# Patient Record
Sex: Female | Born: 1995 | Marital: Single
Health system: Midwestern US, Community
[De-identification: ages and names within clinical notes are randomized; demographics above are authoritative.]

## PROBLEM LIST (undated history)

## (undated) DIAGNOSIS — E039 Hypothyroidism, unspecified: Secondary | ICD-10-CM

## (undated) DIAGNOSIS — R569 Unspecified convulsions: Secondary | ICD-10-CM

## (undated) DIAGNOSIS — G43909 Migraine, unspecified, not intractable, without status migrainosus: Secondary | ICD-10-CM

## (undated) DIAGNOSIS — F445 Conversion disorder with seizures or convulsions: Secondary | ICD-10-CM

## (undated) DIAGNOSIS — N946 Dysmenorrhea, unspecified: Secondary | ICD-10-CM

## (undated) DIAGNOSIS — Z3A28 28 weeks gestation of pregnancy: Secondary | ICD-10-CM

## (undated) DIAGNOSIS — O26843 Uterine size-date discrepancy, third trimester: Secondary | ICD-10-CM

## (undated) HISTORY — PX: LYMPH NODE DISSECTION: SHX5087

---
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
## 2017-11-11 ENCOUNTER — Emergency Department (HOSPITAL_COMMUNITY)
Admission: EM | Admit: 2017-11-11 | Discharge: 2017-11-12 | Disposition: A | Discharge status: Home / Self Care | Payer: BLUE CROSS/BLUE SHIELD | Attending: Emergency Medicine | Admitting: Emergency Medicine

## 2017-11-11 ENCOUNTER — Other Ambulatory Visit: Payer: Self-pay

## 2017-11-11 ENCOUNTER — Encounter (HOSPITAL_COMMUNITY): Payer: Self-pay | Admitting: Emergency Medicine

## 2017-11-11 DIAGNOSIS — R569 Unspecified convulsions: Secondary | ICD-10-CM | POA: Diagnosis not present

## 2017-11-11 DIAGNOSIS — R4182 Altered mental status, unspecified: Secondary | ICD-10-CM | POA: Insufficient documentation

## 2017-11-11 HISTORY — DX: Unspecified convulsions: R56.9

## 2017-11-11 LAB — CBG MONITORING, ED: Glucose-Capillary: 85 mg/dL (ref 70–99)

## 2017-11-11 NOTE — ED Provider Notes (Signed)
[facility] Provider Note   CSN: [id number] Arrival date & time: 11/11/17  2249     History   Chief Complaint Chief Complaint  Patient presents with  . Seizures    HPI Audrey Arnold is a 22 y.o. female.  HPI  This is a 22 year old female with a reported history of seizure who presents after reported seizure activity.  Per EMS report, patient was noted to have tonic-clonic activity.  She was given 5 IM Versed.  No further seizure activity noted.  She reportedly does not take medications for this.  Patient is noncontributory at this time to history taking.  No active seizure activity noted.  She is arousable but does not follow commands and does not answer questions.  Appears to be moving all 4 extremities  Level 5 caveat for altered mental status  1:04 AM  Patient now awake, alert, oriented.  Reports increasing anxiety at school.  States that she last remembers stating that she needed to go for a walk.  She then woke up in the hospital.  Reports history of seizures in high school.  She states that "I had a full work-up."  Reports that she never took medications and that this was related to an implantable contraceptive which she had removed.  She reports one "mild seizure" when she came here for school and had been referred to neurology.  She has an appointment on October 2.  Denies any alcohol or drug use.  Past Medical History:  Diagnosis Date  . Seizures (HCC)     There are no active problems to display for this patient.   OB History   None      Home Medications    Prior to Admission medications   Medication Sig Start Date End Date Taking? Authorizing Provider  levETIRAcetam (KEPPRA) 500 MG tablet Take 1 tablet (500 mg total) by mouth 2 (two) times daily. 11/12/17   Kosisochukwu Burningham, Mayer Maskerourtney F, MD    Family History No family history on file.  Social History Social History   Tobacco Use  . Smoking status: Not on file  Substance Use  Topics  . Alcohol use: Not on file  . Drug use: Not on file     Allergies   Patient has no allergy information on record.   Review of Systems Review of Systems  Unable to perform ROS: Mental status change     Physical Exam Updated Vital Signs BP 132/78   Pulse 79   Temp 98 F (36.7 C) (Oral)   Resp 17   SpO2 97%   Physical Exam  Constitutional: She appears well-developed and well-nourished. No distress.  ABCs intact  HENT:  Head: Normocephalic and atraumatic.  Eyes: Pupils are equal, round, and reactive to light.  Pupils 8 mm reactive bilaterally  Neck: Neck supple.  Cardiovascular: Normal rate, regular rhythm and normal heart sounds.  Pulmonary/Chest: Effort normal and breath sounds normal. No respiratory distress. She has no wheezes.  Abdominal: Soft. There is no tenderness.  Neurological:  Somnolent but arousable to verbal stimulus, does not follow commands at this time, appears to spontaneously move all 4 extremities  Skin: Skin is warm and dry.  Psychiatric: She has a normal mood and affect.  Nursing note and vitals reviewed.    ED Treatments / Results  Labs (all labs ordered are listed, but only abnormal results are displayed) Labs Reviewed  BASIC METABOLIC PANEL - Abnormal; Notable for the following components:  Result Value   Calcium 8.7 (*)    All other components within normal limits  CBC WITH DIFFERENTIAL/PLATELET  CBG MONITORING, ED  I-STAT BETA HCG BLOOD, ED ([facility], [facility], [facility] ONLY)    EKG EKG Interpretation  Date/Time:  Monday November 11 2017 23:03:38 EDT Ventricular Rate:  86 PR Interval:    QRS Duration: 81 QT Interval:  362 QTC Calculation: 433 R Axis:   85 Text Interpretation:  Sinus rhythm Confirmed by Ross Marcus ([id number]) on 11/11/2017 11:40:22 PM   Radiology No results found.  Procedures Procedures (including critical care time)  Medications Ordered in ED Medications  levETIRAcetam (KEPPRA) tablet 500 mg (500 mg Oral  Given 11/12/17 0126)     Initial Impression / Assessment and Plan / ED Course  I have reviewed the triage vital signs and the nursing notes.  Pertinent labs & imaging results that were available during my care of the patient were reviewed by me and considered in my medical decision making (see chart for details).  Clinical Course as of Nov 12 128  Tue Nov 12, 2017  0106 Discussed patient with Dr. Wilford Corner.  Given recurrent seizure, would recommend starting Keppra 500 mg twice daily   [CH]    Clinical Course User Index [CH] Jazalyn Mondor, Mayer Masker, MD    Patient presents with seizure-like activity.  Initially somnolent.  Likely related to postictal versus sedation.  Lab work-up is reassuring.  She is not hypoglycemic.  No metabolic derangements.  On recheck, she is now back to her baseline and appears neurologically intact.  Reports history of seizures and possible recent seizure for which she was referred to neurology.  Given this, I discussed with on-call neurology who recommends starting on antiepileptics.  Will start on Keppra twice daily.  Patient is ambulatory and at her baseline otherwise.  After history, exam, and medical workup I feel the patient has been appropriately medically screened and is safe for discharge home. Pertinent diagnoses were discussed with the patient. Patient was given return precautions.   Final Clinical Impressions(s) / ED Diagnoses   Final diagnoses:  Seizure [facility])    ED Discharge Orders         Ordered    levETIRAcetam (KEPPRA) 500 MG tablet  2 times daily     11/12/17 0129           Shamonique Battiste, Mayer Masker, MD 11/12/17 [phone]

## 2017-11-11 NOTE — ED Triage Notes (Signed)
Pt to ED via EMS, started having seizure like activity x10-15 mins earlier tonight, noted by EMS to be full body, tonic-clonic seizure. S5 versed IM given PTA. Hx of seizure, but takes no meds. Bystanders st she has been anxious recently. Recently had IUD placed and seizures started after placement so she had it removed. Responsive to painful stimuli on arrival.

## 2017-11-12 ENCOUNTER — Emergency Department (HOSPITAL_COMMUNITY): Payer: BLUE CROSS/BLUE SHIELD

## 2017-11-12 ENCOUNTER — Other Ambulatory Visit: Payer: Self-pay

## 2017-11-12 ENCOUNTER — Emergency Department (HOSPITAL_COMMUNITY)
Admission: EM | Admit: 2017-11-12 | Discharge: 2017-11-12 | Disposition: A | Discharge status: Home / Self Care | Payer: BLUE CROSS/BLUE SHIELD | Source: Home / Self Care | Attending: Emergency Medicine | Admitting: Emergency Medicine

## 2017-11-12 ENCOUNTER — Encounter (HOSPITAL_COMMUNITY): Payer: Self-pay

## 2017-11-12 DIAGNOSIS — R569 Unspecified convulsions: Secondary | ICD-10-CM

## 2017-11-12 LAB — BASIC METABOLIC PANEL
ANION GAP: 5 (ref 5–15)
BUN: 16 mg/dL (ref 6–20)
CHLORIDE: 108 mmol/L (ref 98–111)
CO2: 27 mmol/L (ref 22–32)
Calcium: 8.7 mg/dL — ABNORMAL LOW (ref 8.9–10.3)
Creatinine, Ser: 0.9 mg/dL (ref 0.44–1.00)
GFR calc Af Amer: >60 mL/min (ref >60)
GLUCOSE: 86 mg/dL (ref 70–99)
POTASSIUM: 4.4 mmol/L (ref 3.5–5.1)
Sodium: 140 mmol/L (ref 135–145)

## 2017-11-12 LAB — I-STAT BETA HCG BLOOD, ED (MC, WL, AP ONLY): I-stat hCG, quantitative: <5 m[IU]/mL (ref <5)

## 2017-11-12 LAB — COMPREHENSIVE METABOLIC PANEL
ALBUMIN: 4.2 g/dL (ref 3.5–5.0)
ALT: 15 U/L (ref 0–44)
AST: 23 U/L (ref 15–41)
Alkaline Phosphatase: 70 U/L (ref 38–126)
Anion gap: 7 (ref 5–15)
BUN: 17 mg/dL (ref 6–20)
CHLORIDE: 108 mmol/L (ref 98–111)
CO2: 27 mmol/L (ref 22–32)
Calcium: 9.3 mg/dL (ref 8.9–10.3)
Creatinine, Ser: 0.83 mg/dL (ref 0.44–1.00)
GFR calc non Af Amer: >60 mL/min (ref >60)
GLUCOSE: 84 mg/dL (ref 70–99)
Potassium: 4 mmol/L (ref 3.5–5.1)
SODIUM: 142 mmol/L (ref 135–145)
Total Bilirubin: 0.7 mg/dL (ref 0.3–1.2)
Total Protein: 7.2 g/dL (ref 6.5–8.1)

## 2017-11-12 LAB — CBC WITH DIFFERENTIAL/PLATELET
ABS IMMATURE GRANULOCYTES: 0 10*3/uL (ref 0.0–0.1)
Basophils Absolute: 0 10*3/uL (ref 0.0–0.1)
Basophils Absolute: 0.1 10*3/uL (ref 0.0–0.1)
Basophils Relative: 0 %
Basophils Relative: 1 %
Eosinophils Absolute: 0 10*3/uL (ref 0.0–0.7)
Eosinophils Absolute: 0.1 10*3/uL (ref 0.0–0.7)
Eosinophils Relative: 1 %
Eosinophils Relative: 1 %
HEMATOCRIT: 38.7 % (ref 36.0–46.0)
HEMATOCRIT: 39.8 % (ref 36.0–46.0)
HEMOGLOBIN: 12.4 g/dL (ref 12.0–15.0)
HEMOGLOBIN: 13.2 g/dL (ref 12.0–15.0)
IMMATURE GRANULOCYTES: 0 %
LYMPHS ABS: 1.5 10*3/uL (ref 0.7–4.0)
LYMPHS ABS: 2.4 10*3/uL (ref 0.7–4.0)
Lymphocytes Relative: 18 %
Lymphocytes Relative: 24 %
MCH: 30.2 pg (ref 26.0–34.0)
MCH: 30.2 pg (ref 26.0–34.0)
MCHC: 32 g/dL (ref 30.0–36.0)
MCHC: 33.2 g/dL (ref 30.0–36.0)
MCV: 91.1 fL (ref 78.0–100.0)
MCV: 94.2 fL (ref 78.0–100.0)
MONO ABS: 0.9 10*3/uL (ref 0.1–1.0)
MONOS PCT: 7 %
MONOS PCT: 9 %
Monocytes Absolute: 0.6 10*3/uL (ref 0.1–1.0)
NEUTROS ABS: 6.5 10*3/uL (ref 1.7–7.7)
NEUTROS PCT: 65 %
NEUTROS PCT: 74 %
Neutro Abs: 6.2 10*3/uL (ref 1.7–7.7)
Platelets: 207 10*3/uL (ref 150–400)
Platelets: 247 10*3/uL (ref 150–400)
RBC: 4.11 MIL/uL (ref 3.87–5.11)
RBC: 4.37 MIL/uL (ref 3.87–5.11)
RDW: 13 % (ref 11.5–15.5)
RDW: 13.5 % (ref 11.5–15.5)
WBC: 10 10*3/uL (ref 4.0–10.5)
WBC: 8.4 10*3/uL (ref 4.0–10.5)

## 2017-11-12 LAB — CBG MONITORING, ED: GLUCOSE-CAPILLARY: 97 mg/dL (ref 70–99)

## 2017-11-12 MED ORDER — LEVETIRACETAM 500 MG PO TABS
500.0000 mg | ORAL_TABLET | Freq: Once | ORAL | Status: AC
  Administered 2017-11-12: 500 mg via ORAL
  Filled 2017-11-12: qty 1

## 2017-11-12 MED ORDER — LEVETIRACETAM IN NACL 1000 MG/100ML IV SOLN
1000.0000 mg | Freq: Once | INTRAVENOUS | Status: AC
  Administered 2017-11-12: 1000 mg via INTRAVENOUS
  Filled 2017-11-12: qty 100

## 2017-11-12 MED ORDER — SODIUM CHLORIDE 0.9 % IV BOLUS
1000.0000 mL | Freq: Once | INTRAVENOUS | Status: AC
  Administered 2017-11-12: 1000 mL via INTRAVENOUS

## 2017-11-12 MED ORDER — LEVETIRACETAM 500 MG PO TABS: 500.0000 mg | ORAL_TABLET | Freq: Two times a day (BID) | ORAL | 0 refills | Status: DC

## 2017-11-12 NOTE — ED Provider Notes (Signed)
Care assumed from PA list summary at discharge, please see her note for full details, but in brief Audrey Arnold is a 22 y.o. female who was diagnosed with seizures in teens, put on keppra but not currently taking, was told seizures were due to nexplanon, after removal of the Nexplanon patient had no further seizures for 9 months until yesterday.  [facility]ra and discharged home with neuro follow up, when going to pick up her Keppra prescription today she had additional seizures. Seized multiple times today, questionable if true seizure activity.  Received Versed with EMS, but seizure is here in the ED have resolved spontaneously patient loaded with 1000 mg IV Keppra but has not required any additional benzos.  MRI ordered and neurology consulted prior to signout.  MRI pending, no further seizure activity since Keppra.  Plan: Follow-up MRI, obs for 4-6 hours with no further seizure activity (8PM) can be D/C with 500 keppra prior to discharge  If anymore seizures [facility] for [facility] and [facility]  Mr Brain Wo Contrast  Result Date: 11/12/2017 CLINICAL DATA:  22 year old female status post 2nd witnessed seizure in 2 days. Prior to that seizure free for 2 years. EXAM: MRI HEAD WITHOUT CONTRAST TECHNIQUE: Multiplanar, multiecho pulse sequences of the brain and surrounding structures were obtained without intravenous contrast. COMPARISON:  None. FINDINGS: Brain: Normal cerebral volume. No restricted diffusion to suggest acute infarction. No midline shift, mass effect, evidence of mass lesion, ventriculomegaly, extra-axial collection or acute intracranial hemorrhage. Cervicomedullary junction and pituitary are within normal limits. Thin slice T2 and FLAIR imaging of the temporal lobes demonstrates a symmetric and normal appearance of the hippocampal formations. Elsewhere gray and white matter signal is normal throughout the brain. No encephalomalacia or chronic cerebral blood  products identified. Vascular: Major intracranial vascular flow voids are preserved. Skull and upper cervical spine: Normal visible cervical spine. Normal bone marrow signal. Sinuses/Orbits: Normal orbits soft tissues. Paranasal sinuses are clear. Other: Mastoid air cells are clear. Visible internal auditory structures appear normal. Scalp and face soft tissues appear negative. IMPRESSION: Normal noncontrast MRI appearance of the brain. Electronically Signed   By: Odessa FlemingH  Hall M.D.   On: 11/12/2017 17:24   Labs Reviewed  COMPREHENSIVE METABOLIC PANEL  CBC WITH DIFFERENTIAL/PLATELET  CBG MONITORING, ED  I-STAT BETA HCG BLOOD, ED ([facility], [facility], [facility] ONLY)    Has been observed in the emergency department has had no further seizure activity over the past 5 hours.  MRI of the brain is unremarkable.  P.o. dose of Keppra given, patient is back to her baseline with no further post ictal symptoms.  Normal neurologic exam.  Family at the bedside, at this time feel patient is stable for discharge home with close follow-up in the next 2 weeks with neurology.  Patient's family has picked up prescription for Keppra which she will begin taking twice daily as directed.  Driving precautions in place.   At this time there does not appear to be any evidence of an acute emergency medical condition and the patient appears stable for discharge with appropriate outpatient follow up.Diagnosis was discussed with patient who verbalizes understanding and is agreeable to discharge.   Dartha LodgeFord, Kelbi Renstrom N, PA-C 11/12/17 2059    Little, Ambrose Finlandachel Morgan, MD 11/13/17 [phone]

## 2017-11-12 NOTE — ED Notes (Signed)
Bed: [id number] Expected date:  Expected time:  Means of arrival:  Comments: EMS- 22yo F, seizure

## 2017-11-12 NOTE — Discharge Instructions (Addendum)
You were seen today for seizures.  I placed a referral to neurology to see if we can move up your appointment.  Please resume your Keppra tomorrow, 500 mg twice daily.  If you are on oral contraceptive pills, please be aware that they may not be as effective while taking Keppra.  Please return the emergency department if you have any further episodes of seizure-like activity where you are seizing for more than 5 minutes, or you have multiple seizures back to back without return to your baseline.  Please return for any weakness or numbness, new or worsening headaches, or any new or worsening symptoms.  Thank you for allowing Audrey Arnold to participate in your care today.

## 2017-11-12 NOTE — Discharge Instructions (Addendum)
You were seen today for seizure-like activity.  He will be started on seizure medications.  It is very important that you follow-up with neurology as scheduled.  Take seizure medications as directed.  Do not drive or operate heavy machinery until cleared by neurology.

## 2017-11-12 NOTE — ED Provider Notes (Signed)
[facility]-EMERGENCY DEPT Provider Note   CSN: [id number] Arrival date & time: 11/12/17  1419     History   Chief Complaint Chief Complaint  Patient presents with  . Seizures    HPI Audrey Arnold is a 22 y.o. female.  HPI  Patient is a 22 year old female with a history of seizures, not on antiepileptics at present, presenting for repetitive seizures without return to neurologic baseline today.  History obtained with the assistance of patient's friend who witnessed the seizures.  Patient was driving to the pharmacy with her friend today, when in the seat of the car she began having what her friend described as tonic-clonic motions.  He describes them as 7-8 discrete episodes without return to neurologic baseline, lasting approximately 7 to 10 minutes.  Per EMS report, patient was responsive, alert, and oriented when they arrived, but began having a repeat seizure and was given 2.5 mg of Versed.  Patient sleepy but alert voice and to provide history.  Patient reports that she was diagnosed with seizures in high school, approximately 3 years ago, and she was previously on Keppra, but had a period of several months without antiepileptics.  Patient reports she was told that her seizures were due to her Nexplanon, and once it was removed, she had improvement in her seizures until yesterday.  Patient denies any fevers, chills, neck stiffness, chest pain, shortness breath, cough, abdominal pain, nausea, vomiting, diarrhea, or dysuria.  Patient reports a frontal headache that is consistent with all prior headaches at this time, but without visual disturbance, weakness, or numbness over the past 12 hours.  Past Medical History:  Diagnosis Date  . Seizures (HCC)     There are no active problems to display for this patient.   History reviewed. No pertinent surgical history.   OB History   None      Home Medications    Prior to Admission medications   Medication Sig  Start Date End Date Taking? Authorizing Provider  levETIRAcetam (KEPPRA) 500 MG tablet Take 1 tablet (500 mg total) by mouth 2 (two) times daily. 11/12/17   Horton, Mayer Masker, MD    Family History History reviewed. No pertinent family history.  Social History Social History   Tobacco Use  . Smoking status: Not on file  Substance Use Topics  . Alcohol use: Not on file  . Drug use: Not on file     Allergies   Patient has no allergy information on record.   Review of Systems Review of Systems  Constitutional: Negative for chills and fever.  HENT: Negative for congestion and rhinorrhea.   Eyes: Negative for visual disturbance.  Respiratory: Negative for cough and shortness of breath.   Cardiovascular: Negative for chest pain.  Gastrointestinal: Negative for abdominal pain, nausea and vomiting.  Genitourinary: Negative for dysuria.  Musculoskeletal: Negative for myalgias.  Skin: Negative for rash.  Allergic/Immunologic: Negative for immunocompromised state.  Neurological: Positive for seizures and headaches. Negative for weakness and numbness.  Psychiatric/Behavioral: Negative for confusion.     Physical Exam Updated Vital Signs BP 119/69   Pulse 97   Temp 98.1 F (36.7 C) (Oral)   Resp 16   SpO2 96%   Physical Exam  Constitutional: She appears well-developed and well-nourished. No distress.  HENT:  Head: Normocephalic and atraumatic.  Mouth/Throat: Oropharynx is clear and moist.  Eyes: Pupils are equal, round, and reactive to light. Conjunctivae and EOM are normal.  Neck: Normal range of motion. Neck supple.  Cardiovascular: Normal rate, regular rhythm, S1 normal and S2 normal.  No murmur heard. Pulmonary/Chest: Effort normal and breath sounds normal. She has no wheezes. She has no rales.  Nasal cannula in place.  Abdominal: Soft. She exhibits no distension. There is no tenderness. There is no guarding.  Musculoskeletal: Normal range of motion. She exhibits no  edema or deformity.  Neurological: She is alert.  Mental Status:  Patient sleepy on initial examination, recovering from postictal state.  Alert, oriented, thought content appropriate, able to give a coherent history. Speech fluent without evidence of aphasia. Able to follow 2 step commands without difficulty.  Cranial Nerves:  II:  Peripheral visual fields grossly normal, pupils equal, round, reactive to light III,IV, VI: ptosis not present, extra-ocular motions intact bilaterally  V,VII: smile symmetric, facial light touch sensation equal VIII: hearing grossly normal to voice  X: uvula elevates symmetrically  XI: bilateral shoulder shrug symmetric and strong XII: midline tongue extension without fassiculations Motor:  Normal tone. 5/5 in upper and lower extremities bilaterally including strong and equal grip strength and dorsiflexion/plantar flexion Sensory: Pinprick and light touch normal in all extremities.  Deep Tendon Reflexes: 2+ and symmetric in the biceps and patella.  Cerebellar: normal finger-to-nose with bilateral upper extremities Gait: Deferred  Stance: No pronator drift and good coordination, strength, and position sense with tapping of bilateral arms (performed in sitting position). CV: distal pulses palpable throughout    Skin: Skin is warm and dry. No rash noted. No erythema.  Psychiatric: She has a normal mood and affect. Her behavior is normal. Judgment and thought content normal.  Nursing note and vitals reviewed.    ED Treatments / Results  Labs (all labs ordered are listed, but only abnormal results are displayed) Labs Reviewed  COMPREHENSIVE METABOLIC PANEL  CBC WITH DIFFERENTIAL/PLATELET  CBG MONITORING, ED  I-STAT BETA HCG BLOOD, ED ([facility], [facility], [facility] ONLY)    EKG EKG Interpretation  Date/Time:  Tuesday November 12 2017 14:40:49 EDT Ventricular Rate:  88 PR Interval:    QRS Duration: 79 QT Interval:  359 QTC Calculation: 435 R Axis:   86 Text  Interpretation:  Sinus rhythm Confirmed by Loren RacerYelverton, David ([id number]) on 11/12/2017 3:20:58 PM   Radiology No results found.  Procedures Procedures (including critical care time)  Medications Ordered in ED Medications  levETIRAcetam (KEPPRA) IVPB 1000 mg/100 mL premix (1,000 mg Intravenous New Bag/Given 11/12/17 1532)  sodium chloride 0.9 % bolus 1,000 mL (has no administration in time range)     Initial Impression / Assessment and Plan / ED Course  I have reviewed the triage vital signs and the nursing notes.  Pertinent labs & imaging results that were available during my care of the patient were reviewed by me and considered in my medical decision making (see chart for details).  Clinical Course as of Nov 12 1541  Tue Nov 12, 2017  1541 Reassessed at bedside, as patient had repetitive seizure-like activity.  Patient had full body clonic motions, as well as apparent posturing of back.  Patient had decreased muscle tone on examination during seizure like activity.  Heart rate 143 during seizure-like activity.   [AM]    Clinical Course User Index [AM] Elisha PonderMurray, Barbaraann Avans B, PA-C   3:53 PM Spoke with Dr. Laurence SlateAroor of neurology, who recommends MRI, 1000 mg Keppra loading dose, and if patient has any further seizure-like activity, [facility] further work-up.  Also recommends that patient received p.o. dose  of Keppra prior to leaving.  Recommends expedited follow-up with neurology, which I will arrange. Recommends 4-6 hour observation w/o seizure-like activity.  I appreciate Dr. Delaney Meigs involvement in the care of this patient.  Patient updated on plan of care.  If patient is able to go home, patient to resume normal Keppra regimen tomorrow.  Care signed out to Jodi Geralds, PA-C at 4:47 PM.   This is a shared visit with Dr. Loren Racer. Patient was independently evaluated by this attending physician. Attending physician consulted in evaluation and  management.  Final Clinical Impressions(s) / ED Diagnoses   Final diagnoses:  Seizure [facility])    ED Discharge Orders    None       Delia Chimes 11/12/17 1648    Loren Racer, MD 11/13/17 1327

## 2017-11-12 NOTE — ED Triage Notes (Signed)
EMS reports witnesses seizure, grand maul. Hx of seizures non compliant with meds, [facility] has not [facility] RX.  BP 120/58 HR 110 RR 28 CBG 90  20 L forearm 2.5 Versed IV enroute.

## 2017-11-13 ENCOUNTER — Encounter (HOSPITAL_COMMUNITY): Payer: Self-pay

## 2017-11-13 ENCOUNTER — Emergency Department (HOSPITAL_COMMUNITY)
Admission: EM | Admit: 2017-11-13 | Discharge: 2017-11-14 | Disposition: A | Discharge status: Home / Self Care | Payer: BLUE CROSS/BLUE SHIELD | Attending: Emergency Medicine | Admitting: Emergency Medicine

## 2017-11-13 ENCOUNTER — Other Ambulatory Visit: Payer: Self-pay

## 2017-11-13 DIAGNOSIS — R569 Unspecified convulsions: Secondary | ICD-10-CM | POA: Diagnosis not present

## 2017-11-13 DIAGNOSIS — Z79899 Other long term (current) drug therapy: Secondary | ICD-10-CM | POA: Diagnosis not present

## 2017-11-13 NOTE — ED Notes (Signed)
Seizure pads applied to stretcher, suction available at bedside. Pt protecting airway at this time, no seizure activity noted, VSS

## 2017-11-13 NOTE — ED Triage Notes (Signed)
Pt BIB [facility] for eval of seizure. Pt has been seen twice in past two days for seizure activity. This is third seizure episode in 3 days. Ems reports friends witnessed one seizure, called 911, as EMS was loading pt into ambulance, noted a second seizure lasting approx 45 seconds. Pt received 5mg  versed by EMS, pt arrives somnolent, but able to answer some questions. No fall w/ seizure or injury noted.  Satting 99% on RA

## 2017-11-14 NOTE — ED Provider Notes (Signed)
[facility] EMERGENCY DEPARTMENT Provider Note  CSN: [id number] Arrival date & time: 11/13/17 2256  Chief Complaint(s) Seizures  HPI Audrey Arnold is a 22 y.o. female with a history of seizures presents to the emergency department after seizure episode that lasted approximately 45 seconds.  EMS was called who gave the patient 5 mg of Versed in route as she had another episode that they witnessed. This is a third episode in 3 days.  Patient was started on Keppra on 26 August.  She was unable to get the medication until yesterday.  She was actually seen yesterday and loaded with Keppra in the ED.  Patient had a negative MRI.   Patient is sedated but is able to answer questions.  She denies any recent fevers or infections.  States that she is been under a lot of stress due to the seizures coming back in school.  Denies any drug use.  HPI  Past Medical History Past Medical History:  Diagnosis Date  . Seizures (HCC)    There are no active problems to display for this patient.  Home Medication(s) Prior to Admission medications   Medication Sig Start Date End Date Taking? Authorizing Provider  levETIRAcetam (KEPPRA) 500 MG tablet Take 1 tablet (500 mg total) by mouth 2 (two) times daily. 11/12/17   Horton, Mayer Masker, MD  levothyroxine (SYNTHROID, LEVOTHROID) 50 MCG tablet Take 50 mcg by mouth daily. 08/30/17   [provider]                                                                                                                                    Past Surgical History History reviewed. No pertinent surgical history. Family History History reviewed. No pertinent family history.  Social History Social History   Tobacco Use  . Smoking status: Not on file  Substance Use Topics  . Alcohol use: Not on file  . Drug use: Not on file   Allergies Patient has no known allergies.  Review of Systems Review of Systems All other systems are reviewed and are  negative for acute change except as noted in the HPI  Physical Exam Vital Signs  I have reviewed the triage vital signs BP 104/90   Pulse 83   Temp 98.2 F (36.8 C)   Resp 17   Wt 59 kg   SpO2 100%   Physical Exam  Constitutional: She is oriented to person, place, and time. She appears well-developed and well-nourished. No distress.  HENT:  Head: Normocephalic and atraumatic.  Right Ear: External ear normal.  Left Ear: External ear normal.  Nose: Nose normal.  Eyes: Conjunctivae and EOM are normal. No scleral icterus.  Neck: Normal range of motion and phonation normal.  Cardiovascular: Normal rate and regular rhythm.  Pulmonary/Chest: Effort normal. No stridor. No respiratory distress.  Abdominal: She exhibits no distension.  Musculoskeletal: Normal range of motion. She exhibits no edema.  Neurological: She is alert and oriented to person, place, and time.  Moves all extremities with equal strength.  Skin: She is not diaphoretic.  Psychiatric: She has a normal mood and affect. Her behavior is normal.  Vitals reviewed.   ED Results and Treatments Labs (all labs ordered are listed, but only abnormal results are displayed) Labs Reviewed - No data to display                                                                                                                       EKG  EKG Interpretation  Date/Time:    Ventricular Rate:    PR Interval:    QRS Duration:   QT Interval:    QTC Calculation:   R Axis:     Text Interpretation:        Radiology No results found. Pertinent labs & imaging results that were available during my care of the patient were reviewed by me and considered in my medical decision making (see chart for details).  Medications Ordered in ED Medications - No data to display                                                                                                                                  Procedures Procedures  (including  critical care time)  Medical Decision Making / ED Course I have reviewed the nursing notes for this encounter and the patient's prior records (if available in EHR or on provided paperwork).    Seizure status post 5 mg of Versed.  Patient is currently chemically sedated.  We will continue to monitor.   Patient has had extensive work-up recently and does not require any additional work-up at this time.  After several hours, patient returned to baseline mental status.  The patient appears reasonably screened and/or stabilized for discharge and I doubt any other medical condition or other [facility] requiring further screening, evaluation, or treatment in the ED at this time prior to discharge.  The patient is safe for discharge with strict return precautions.   Final Clinical Impression(s) / ED Diagnoses Final diagnoses:  Seizure (HCC)    Disposition: Discharge  Condition: Good  I have discussed the results, Dx and Tx plan with the patient who expressed understanding and agree(s) with the plan. Discharge instructions discussed at great length. The patient was given strict return precautions who verbalized understanding of the instructions. No further questions  at time of discharge.    ED Discharge Orders    None       Follow Up: Neurology   As scheduled     This chart was dictated using voice recognition software.  Despite best efforts to proofread,  errors can occur which can change the documentation meaning.   Nira Connardama, Pedro Eduardo, MD 11/14/17 [phone]

## 2017-11-15 ENCOUNTER — Emergency Department (HOSPITAL_COMMUNITY): Payer: BLUE CROSS/BLUE SHIELD

## 2017-11-15 ENCOUNTER — Encounter: Payer: Self-pay | Admitting: *Deleted

## 2017-11-15 ENCOUNTER — Observation Stay (HOSPITAL_COMMUNITY)
Admission: EM | Admit: 2017-11-15 | Discharge: 2017-11-17 | Disposition: A | Discharge status: Home / Self Care | Payer: BLUE CROSS/BLUE SHIELD | Attending: Internal Medicine | Admitting: Internal Medicine

## 2017-11-15 ENCOUNTER — Other Ambulatory Visit: Payer: Self-pay

## 2017-11-15 DIAGNOSIS — R569 Unspecified convulsions: Principal | ICD-10-CM

## 2017-11-15 DIAGNOSIS — Z79899 Other long term (current) drug therapy: Secondary | ICD-10-CM | POA: Diagnosis not present

## 2017-11-15 HISTORY — DX: Migraine, unspecified, not intractable, without status migrainosus: G43.909

## 2017-11-15 LAB — BASIC METABOLIC PANEL
Anion gap: 8 (ref 5–15)
BUN: 15 mg/dL (ref 6–20)
CO2: 27 mmol/L (ref 22–32)
CREATININE: 0.89 mg/dL (ref 0.44–1.00)
Calcium: 9.3 mg/dL (ref 8.9–10.3)
Chloride: 107 mmol/L (ref 98–111)
GFR calc Af Amer: >60 mL/min (ref >60)
Glucose, Bld: 75 mg/dL (ref 70–99)
Potassium: 4.1 mmol/L (ref 3.5–5.1)
SODIUM: 142 mmol/L (ref 135–145)

## 2017-11-15 LAB — CBC WITH DIFFERENTIAL/PLATELET
Basophils Absolute: 0 10*3/uL (ref 0.0–0.1)
Basophils Relative: 0 %
EOS ABS: 0.1 10*3/uL (ref 0.0–0.7)
EOS PCT: 1 %
HCT: 39.6 % (ref 36.0–46.0)
Hemoglobin: 13.2 g/dL (ref 12.0–15.0)
LYMPHS ABS: 2.1 10*3/uL (ref 0.7–4.0)
Lymphocytes Relative: 25 %
MCH: 30.3 pg (ref 26.0–34.0)
MCHC: 33.3 g/dL (ref 30.0–36.0)
MCV: 90.8 fL (ref 78.0–100.0)
MONO ABS: 0.6 10*3/uL (ref 0.1–1.0)
MONOS PCT: 7 %
Neutro Abs: 5.5 10*3/uL (ref 1.7–7.7)
Neutrophils Relative %: 67 %
PLATELETS: 248 10*3/uL (ref 150–400)
RBC: 4.36 MIL/uL (ref 3.87–5.11)
RDW: 13.1 % (ref 11.5–15.5)
WBC: 8.2 10*3/uL (ref 4.0–10.5)

## 2017-11-15 LAB — GLUCOSE, CAPILLARY: GLUCOSE-CAPILLARY: 83 mg/dL (ref 70–99)

## 2017-11-15 LAB — I-STAT BETA HCG BLOOD, ED (MC, WL, AP ONLY)

## 2017-11-15 LAB — CBG MONITORING, ED: Glucose-Capillary: 83 mg/dL (ref 70–99)

## 2017-11-15 MED ORDER — ACETAMINOPHEN 325 MG PO TABS
650.0000 mg | ORAL_TABLET | Freq: Four times a day (QID) | ORAL | Status: DC | PRN
  Administered 2017-11-16 – 2017-11-17 (×2): 650 mg via ORAL
  Filled 2017-11-15 (×2): qty 2

## 2017-11-15 MED ORDER — AMMONIA AROMATIC IN INHA
RESPIRATORY_TRACT | Status: AC
  Administered 2017-11-15: 0.3 mL via RESPIRATORY_TRACT
  Filled 2017-11-15: qty 10

## 2017-11-15 MED ORDER — LORAZEPAM 2 MG/ML IJ SOLN
INTRAMUSCULAR | Status: AC
  Administered 2017-11-15: 1 mg via INTRAVENOUS
  Filled 2017-11-15: qty 1

## 2017-11-15 MED ORDER — SODIUM CHLORIDE 0.9 % IV SOLN
INTRAVENOUS | Status: AC
  Administered 2017-11-15: via INTRAVENOUS

## 2017-11-15 MED ORDER — ENOXAPARIN SODIUM 40 MG/0.4ML ~~LOC~~ SOLN
40.0000 mg | SUBCUTANEOUS | Status: DC
  Administered 2017-11-15 – 2017-11-16 (×2): 40 mg via SUBCUTANEOUS
  Filled 2017-11-15 (×2): qty 0.4

## 2017-11-15 MED ORDER — LEVOTHYROXINE SODIUM 50 MCG PO TABS
50.0000 ug | ORAL_TABLET | Freq: Every day | ORAL | Status: DC
  Administered 2017-11-16 – 2017-11-17 (×2): 50 ug via ORAL
  Filled 2017-11-15 (×2): qty 1

## 2017-11-15 MED ORDER — LEVETIRACETAM 500 MG PO TABS
500.0000 mg | ORAL_TABLET | Freq: Two times a day (BID) | ORAL | Status: DC
  Administered 2017-11-16 – 2017-11-17 (×3): 500 mg via ORAL
  Filled 2017-11-15 (×3): qty 1

## 2017-11-15 MED ORDER — LEVETIRACETAM IN NACL 1000 MG/100ML IV SOLN
1000.0000 mg | Freq: Once | INTRAVENOUS | Status: AC
  Administered 2017-11-15: 1000 mg via INTRAVENOUS
  Filled 2017-11-15: qty 100

## 2017-11-15 MED ORDER — LORAZEPAM 2 MG/ML IJ SOLN
1.0000 mg | Freq: Once | INTRAMUSCULAR | Status: AC
  Administered 2017-11-15: 1 mg via INTRAVENOUS

## 2017-11-15 MED ORDER — ACETAMINOPHEN 650 MG RE SUPP: 650.0000 mg | Freq: Four times a day (QID) | RECTAL | Status: DC | PRN

## 2017-11-15 MED ORDER — AMMONIA AROMATIC IN INHA
0.3000 mL | Freq: Once | RESPIRATORY_TRACT | Status: AC
  Administered 2017-11-15: 0.3 mL via RESPIRATORY_TRACT

## 2017-11-15 NOTE — ED Notes (Signed)
Pt Is being transported by [facility] at this time

## 2017-11-15 NOTE — H&P (Signed)
[facility] H&P   Patient Demographics:    Audrey Arnold, is a 22 y.o. female  MRN: [id number]   DOB - Dec 11, 1995  Admit Date - 11/15/2017  Outpatient Primary MD for the patient is Patient, No Pcp Per  Referring MD/NP/PA:   [place]  Outpatient Specialists:   Patient coming from: home  Chief Complaint  Patient presents with  . Seizures      HPI:    Audrey Arnold  is a 22 y.o. female, w hypothyroidism, seizure do apparently c/o seizure x 45 sec. 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 Doesn't recall if bit tongue , or incontinence.   EMS gave her versed.  4th episode in 3 days.  Pt states that she has been taking her keppra.  No new medication. Pt notes initially started to have seizure about 2.5 years ago.  None until the past few day.    In ED,  CT brain IMPRESSION: Normal CT appearance of the brain.  No acute cervical spine fracture or listhesis.  Wbc 8.2, hgb 13.2, Plt 248  Na 142, K 4.1, Bun 15, Creatinine 0.89  Pt will be admitted for w/up of seizure.      Review of systems:    In addition to the HPI above,     No Fever-chills, No Headache, No changes with Vision or hearing, No problems swallowing food or Liquids, No Chest pain, Cough or Shortness of Breath, No Abdominal pain, No Nausea or Vommitting, Bowel movements are regular, No Blood in stool or Urine, No dysuria, No new skin rashes or bruises, No new joints pains-aches,  No new weakness, tingling, numbness in any extremity, No recent weight gain or loss, No polyuria, polydypsia or polyphagia, No significant Mental Stressors.  A full 10 point Review of Systems was done, except as stated above, all other Review of Systems were negative.   With Past History of the following :    Past Medical History:  Diagnosis Date  . Migraine   . Seizures (HCC)       Past Surgical History:    Procedure Laterality Date  . LYMPH NODE DISSECTION        Social History:     Social History   Tobacco Use  . Smoking status: Never Smoker  . Smokeless tobacco: Never Used  Substance Use Topics  . Alcohol use: Never    Frequency: Never     Lives - at home  Mobility - walks by self   Family History :     Family History  Problem Relation Age of Onset  . Lupus Mother   . Diverticulitis Mother   . Hypertension Father        Home Medications:   Prior to Admission medications   Medication Sig Start Date End Date Taking? Authorizing Provider  levETIRAcetam (KEPPRA) 500 MG tablet Take 1 tablet (500 mg  total) by mouth 2 (two) times daily. 11/12/17  Yes Horton, Mayer Masker, MD  levothyroxine (SYNTHROID, LEVOTHROID) 50 MCG tablet Take 50 mcg by mouth daily. 08/30/17  Yes [provider]     Allergies:    No Known Allergies   Physical Exam:   Vitals  Blood pressure 114/77, pulse 89, temperature 98.8 F (37.1 C), temperature source Oral, resp. rate 17, SpO2 100 %.   1. General  lying in bed in NAD,    2. Normal affect and insight, Not Suicidal or Homicidal, Awake Alert, Oriented X 3.  3. No F.N deficits, ALL C.Nerves Intact, Strength 5/5 all 4 extremities, Sensation intact all 4 extremities, Plantars down going.  4. Ears and Eyes appear Normal, Conjunctivae clear, PERRLA. Moist Oral Mucosa.  5. Supple Neck, No JVD, No cervical lymphadenopathy appriciated, No Carotid Bruits.  6. Symmetrical Chest wall movement, Good air movement bilaterally, CTAB.  7. RRR, No Gallops, Rubs or Murmurs, No Parasternal Heave.  8. Positive Bowel Sounds, Abdomen Soft, No tenderness, No organomegaly appriciated,No rebound -guarding or rigidity.  9.  No Cyanosis, Normal Skin Turgor, No Skin Rash or Bruise.  10. Good muscle tone,  joints appear normal , no effusions, Normal ROM.  11. No Palpable Lymph Nodes in Neck or Axillae  No pronator drift   Data Review:     CBC Recent Labs  Lab 11/11/17 2348 11/12/17 1512 11/15/17 1646  WBC 10.0 8.4 8.2  HGB 12.4 13.2 13.2  HCT 38.7 39.8 39.6  PLT 207 247 248  MCV 94.2 91.1 90.8  MCH 30.2 30.2 30.3  MCHC 32.0 33.2 33.3  RDW 13.0 13.5 13.1  LYMPHSABS 2.4 1.5 2.1  MONOABS 0.9 0.6 0.6  EOSABS 0.1 0.0 0.1  BASOSABS 0.1 0.0 0.0   ------------------------------------------------------------------------------------------------------------------  Chemistries  Recent Labs  Lab 11/11/17 2348 11/12/17 1512 11/15/17 1646  NA 140 142 142  K 4.4 4.0 4.1  CL 108 108 107  CO2 27 27 27   GLUCOSE 86 84 75  BUN 16 17 15   CREATININE 0.90 0.83 0.89  CALCIUM 8.7* 9.3 9.3  AST  --  23  --   ALT  --  15  --   ALKPHOS  --  70  --   BILITOT  --  0.7  --    ------------------------------------------------------------------------------------------------------------------ CrCl cannot be calculated (Unknown ideal weight.). ------------------------------------------------------------------------------------------------------------------ No results for input(s): TSH, T4TOTAL, T3FREE, THYROIDAB in the last 72 hours.  Invalid input(s): FREET3  Coagulation profile No results for input(s): INR, PROTIME in the last 168 hours. ------------------------------------------------------------------------------------------------------------------- No results for input(s): DDIMER in the last 72 hours. -------------------------------------------------------------------------------------------------------------------  Cardiac Enzymes No results for input(s): CKMB, TROPONINI, MYOGLOBIN in the last 168 hours.  Invalid input(s): CK ------------------------------------------------------------------------------------------------------------------ No results found for: BNP   ---------------------------------------------------------------------------------------------------------------  Urinalysis No results found for:  COLORURINE, APPEARANCEUR, LABSPEC, PHURINE, GLUCOSEU, HGBUR, BILIRUBINUR, KETONESUR, PROTEINUR, UROBILINOGEN, NITRITE, LEUKOCYTESUR  ----------------------------------------------------------------------------------------------------------------   Imaging Results:    Ct Head Wo Contrast  Result Date: 11/15/2017 CLINICAL DATA:  Multiple seizures over the past week with 3 witnessed seizures in a 5 minutes. Today. EXAM: CT HEAD WITHOUT CONTRAST CT CERVICAL SPINE WITHOUT CONTRAST TECHNIQUE: Multidetector CT imaging of the head and cervical spine was performed following the standard protocol without intravenous contrast. Multiplanar CT image reconstructions of the cervical spine were also generated. COMPARISON:  MRI head 11/12/2017 FINDINGS: CT HEAD FINDINGS BRAIN: The ventricles and sulci are normal. No intraparenchymal hemorrhage, mass effect nor midline shift. No acute large vascular territory  infarcts. No abnormal extra-axial fluid collections. Basal cisterns are midline and not effaced. No acute brainstem nor cerebellar abnormality. VASCULAR: Unremarkable. SKULL/SOFT TISSUES: No skull fracture. No significant soft tissue swelling. ORBITS/SINUSES: The included ocular globes and orbital contents are normal.The mastoid air-cells and included paranasal sinuses are well-aerated. OTHER: None. CT CERVICAL SPINE FINDINGS ALIGNMENT: Vertebral bodies in alignment. Maintained lordosis. SKULL BASE AND VERTEBRAE: Cervical vertebral bodies and posterior elements are intact. Intervertebral disc heights preserved. No destructive bony lesions. C1-2 articulation maintained. SOFT TISSUES AND SPINAL CANAL: Normal. DISC LEVELS: No significant osseous canal stenosis or neural foraminal narrowing. UPPER CHEST: Lung apices are clear. OTHER: None. IMPRESSION: Normal CT appearance of the brain. No acute cervical spine fracture or listhesis. Electronically Signed   By: Tollie Ethavid  Kwon M.D.   On: 11/15/2017 18:26   Ct Cervical Spine Wo  Contrast  Result Date: 11/15/2017 CLINICAL DATA:  Multiple seizures over the past week with 3 witnessed seizures in a 5 minutes. Today. EXAM: CT HEAD WITHOUT CONTRAST CT CERVICAL SPINE WITHOUT CONTRAST TECHNIQUE: Multidetector CT imaging of the head and cervical spine was performed following the standard protocol without intravenous contrast. Multiplanar CT image reconstructions of the cervical spine were also generated. COMPARISON:  MRI head 11/12/2017 FINDINGS: CT HEAD FINDINGS BRAIN: The ventricles and sulci are normal. No intraparenchymal hemorrhage, mass effect nor midline shift. No acute large vascular territory infarcts. No abnormal extra-axial fluid collections. Basal cisterns are midline and not effaced. No acute brainstem nor cerebellar abnormality. VASCULAR: Unremarkable. SKULL/SOFT TISSUES: No skull fracture. No significant soft tissue swelling. ORBITS/SINUSES: The included ocular globes and orbital contents are normal.The mastoid air-cells and included paranasal sinuses are well-aerated. OTHER: None. CT CERVICAL SPINE FINDINGS ALIGNMENT: Vertebral bodies in alignment. Maintained lordosis. SKULL BASE AND VERTEBRAE: Cervical vertebral bodies and posterior elements are intact. Intervertebral disc heights preserved. No destructive bony lesions. C1-2 articulation maintained. SOFT TISSUES AND SPINAL CANAL: Normal. DISC LEVELS: No significant osseous canal stenosis or neural foraminal narrowing. UPPER CHEST: Lung apices are clear. OTHER: None. IMPRESSION: Normal CT appearance of the brain. No acute cervical spine fracture or listhesis. Electronically Signed   By: Tollie Ethavid  Kwon M.D.   On: 11/15/2017 18:26       Assessment & Plan:    Active Problems:   Seizure (HCC)    Seizure do Seizure precautions MRI brain 11/12/2017=> no acute process EEG ordered keppra 1000mg  iv x1 in ED Cont keppra 500mg  po bid Neurology consulted by ED, defer to neurolgy as to adjustment of keppra or initiation of 2nd  antiepileptic  DVT Prophylaxis  Lovenox - SCDs   AM Labs Ordered, also please review Full Orders  Family Communication: Admission, patients condition and plan of care including tests being ordered have been discussed with the patient  who indicate understanding and agree with the plan and Code Status.  Code Status FULL CODE  Likely DC to  home  Condition GUARDED    Consults called: neurology by ED  Admission status: observation,  Pt requires admission to the hospital for recurrent seizure, and iv keppra (given in ED), and EEG.  Pt will probably need observation but this could possibly turn into and inpatient stay if seizures are unable to be controlled.   Time spent in minutes : 55 minutes   Pearson GrippeJames Oryon Gary M.D on 11/15/2017 at 8:46 PM  Between 7am to 7pm - Pager - [phone]   After 7pm go to [email or web address] - password [email or web address]  [facility] - Office  [phone]

## 2017-11-15 NOTE — ED Provider Notes (Signed)
[facility]-EMERGENCY DEPT Provider Note   CSN: [id number] Arrival date & time: 11/15/17  1609     History   Chief Complaint Chief Complaint  Patient presents with  . Seizures    HPI Audrey Arnold is a 22 y.o. female.  Level 5 caveat due to altered mental status.  Patient with seizure-like activity and was given 2.5 mg of Versed by EMS.  Has had multiple visits to the ED this week for seizures.  Patient is supposedly on Keppra.  Patient is somnolent and unable to answer questions.  The history is provided by the patient.  Seizures   This is a recurrent problem. The current episode started 1 to 2 hours ago. The problem has been resolved. The episode was witnessed. The seizures did not continue in the ED. Meds prior to arrival: 2.5 mg of versed.    Past Medical History:  Diagnosis Date  . Migraine   . Seizures [facility])     Patient Active Problem List   Diagnosis Date Noted  . Seizure (HCC) 11/15/2017    History reviewed. No pertinent surgical history.   OB History   None      Home Medications    Prior to Admission medications   Medication Sig Start Date End Date Taking? Authorizing Provider  levETIRAcetam (KEPPRA) 500 MG tablet Take 1 tablet (500 mg total) by mouth 2 (two) times daily. 11/12/17  Yes Horton, Mayer Masker, MD  levothyroxine (SYNTHROID, LEVOTHROID) 50 MCG tablet Take 50 mcg by mouth daily. 08/30/17  Yes [provider]    Family History History reviewed. No pertinent family history.  Social History Social History   Tobacco Use  . Smoking status: Never Smoker  Substance Use Topics  . Alcohol use: Not on file  . Drug use: Not on file     Allergies   Patient has no known allergies.   Review of Systems Review of Systems  Unable to perform ROS: Mental status change  Neurological: Positive for seizures.     Physical Exam Updated Vital Signs  ED Triage Vitals [11/15/17 1619]  Enc Vitals Group     BP 120/72    Pulse Rate (!) 101     Resp 16     Temp      Temp src      SpO2 100 %     Weight      Height      Head Circumference      Peak Flow      Pain Score      Pain Loc      Pain Edu?      Excl. in GC?     Physical Exam  Constitutional: She appears lethargic. No distress.  somnolent  HENT:  Head: Normocephalic and atraumatic.  Eyes: Pupils are equal, round, and reactive to light. Conjunctivae and EOM are normal.  Neck: Neck supple.  Cardiovascular: Normal rate and regular rhythm.  No murmur heard. Pulmonary/Chest: Effort normal and breath sounds normal. No respiratory distress.  Abdominal: Soft. There is no tenderness.  Musculoskeletal: She exhibits no edema.  Neurological: She appears lethargic. GCS eye subscore is 4. GCS verbal subscore is 2. GCS motor subscore is 4.  Skin: Skin is warm and dry.  Psychiatric: She has a normal mood and affect.  Nursing note and vitals reviewed.    ED Treatments / Results  Labs (all labs ordered are listed, but only abnormal results are displayed) Labs Reviewed  CBC WITH DIFFERENTIAL/PLATELET  BASIC METABOLIC PANEL  HIV ANTIBODY (ROUTINE TESTING)  COMPREHENSIVE METABOLIC PANEL  CBC  PROLACTIN  CK TOTAL AND CKMB (NOT AT [facility])  HEPATIC FUNCTION PANEL  I-STAT BETA HCG BLOOD, ED ([facility], [facility], [facility] ONLY)  CBG MONITORING, ED    EKG None  Radiology Ct Head Wo Contrast  Result Date: 11/15/2017 CLINICAL DATA:  Multiple seizures over the past week with 3 witnessed seizures in a 5 minutes. Today. EXAM: CT HEAD WITHOUT CONTRAST CT CERVICAL SPINE WITHOUT CONTRAST TECHNIQUE: Multidetector CT imaging of the head and cervical spine was performed following the standard protocol without intravenous contrast. Multiplanar CT image reconstructions of the cervical spine were also generated. COMPARISON:  MRI head 11/12/2017 FINDINGS: CT HEAD FINDINGS BRAIN: The ventricles and sulci are normal. No intraparenchymal hemorrhage, mass effect nor midline shift. No  acute large vascular territory infarcts. No abnormal extra-axial fluid collections. Basal cisterns are midline and not effaced. No acute brainstem nor cerebellar abnormality. VASCULAR: Unremarkable. SKULL/SOFT TISSUES: No skull fracture. No significant soft tissue swelling. ORBITS/SINUSES: The included ocular globes and orbital contents are normal.The mastoid air-cells and included paranasal sinuses are well-aerated. OTHER: None. CT CERVICAL SPINE FINDINGS ALIGNMENT: Vertebral bodies in alignment. Maintained lordosis. SKULL BASE AND VERTEBRAE: Cervical vertebral bodies and posterior elements are intact. Intervertebral disc heights preserved. No destructive bony lesions. C1-2 articulation maintained. SOFT TISSUES AND SPINAL CANAL: Normal. DISC LEVELS: No significant osseous canal stenosis or neural foraminal narrowing. UPPER CHEST: Lung apices are clear. OTHER: None. IMPRESSION: Normal CT appearance of the brain. No acute cervical spine fracture or listhesis. Electronically Signed   By: Tollie Eth M.D.   On: 11/15/2017 18:26   Ct Cervical Spine Wo Contrast  Result Date: 11/15/2017 CLINICAL DATA:  Multiple seizures over the past week with 3 witnessed seizures in a 5 minutes. Today. EXAM: CT HEAD WITHOUT CONTRAST CT CERVICAL SPINE WITHOUT CONTRAST TECHNIQUE: Multidetector CT imaging of the head and cervical spine was performed following the standard protocol without intravenous contrast. Multiplanar CT image reconstructions of the cervical spine were also generated. COMPARISON:  MRI head 11/12/2017 FINDINGS: CT HEAD FINDINGS BRAIN: The ventricles and sulci are normal. No intraparenchymal hemorrhage, mass effect nor midline shift. No acute large vascular territory infarcts. No abnormal extra-axial fluid collections. Basal cisterns are midline and not effaced. No acute brainstem nor cerebellar abnormality. VASCULAR: Unremarkable. SKULL/SOFT TISSUES: No skull fracture. No significant soft tissue swelling.  ORBITS/SINUSES: The included ocular globes and orbital contents are normal.The mastoid air-cells and included paranasal sinuses are well-aerated. OTHER: None. CT CERVICAL SPINE FINDINGS ALIGNMENT: Vertebral bodies in alignment. Maintained lordosis. SKULL BASE AND VERTEBRAE: Cervical vertebral bodies and posterior elements are intact. Intervertebral disc heights preserved. No destructive bony lesions. C1-2 articulation maintained. SOFT TISSUES AND SPINAL CANAL: Normal. DISC LEVELS: No significant osseous canal stenosis or neural foraminal narrowing. UPPER CHEST: Lung apices are clear. OTHER: None. IMPRESSION: Normal CT appearance of the brain. No acute cervical spine fracture or listhesis. Electronically Signed   By: Tollie Eth M.D.   On: 11/15/2017 18:26    Procedures .Critical Care Performed by: Virgina Norfolk, DO Authorized by: Virgina Norfolk, DO   Critical care provider statement:    Critical care time (minutes):  35   Critical care was necessary to treat or prevent imminent or life-threatening deterioration of the following conditions:  CNS failure or compromise (Seizures)   Critical care was time spent personally by me on the following activities:  Development of treatment plan with patient or surrogate, discussions with  consultants, discussions with primary provider, evaluation of patient's response to treatment, ordering and performing treatments and interventions, ordering and review of laboratory studies, ordering and review of radiographic studies, pulse oximetry and re-evaluation of patient's condition   (including critical care time)  Medications Ordered in ED Medications  enoxaparin (LOVENOX) injection 40 mg (has no administration in time range)  acetaminophen (TYLENOL) tablet 650 mg (has no administration in time range)    Or  acetaminophen (TYLENOL) suppository 650 mg (has no administration in time range)  0.9 %  sodium chloride infusion (has no administration in time range)    levETIRAcetam (KEPPRA) IVPB 1000 mg/100 mL premix (0 mg Intravenous Stopped 11/15/17 1715)  ammonia inhalant 0.3 mL (0.3 mLs Inhalation Given by Other 11/15/17 1843)  LORazepam (ATIVAN) injection 1 mg (1 mg Intravenous Given 11/15/17 1848)     Initial Impression / Assessment and Plan / ED Course  I have reviewed the triage vital signs and the nursing notes.  Pertinent labs & imaging results that were available during my care of the patient were reviewed by me and considered in my medical decision making (see chart for details).     Audrey Arnold is a 22 year old female with history of seizures who presents to the ED following seizure-like activity.  Patient with normal vitals.  No fever.  Patient supposedly with seizure-like activity with EMS and was given 2.5 mg of Versed.  Patient is somnolent upon my evaluation.  Patient unknown if she had a fall.  Patient in a c-collar.  Patient with multiple ED visits this week for seizure-like activity.  Supposedly is on Keppra.  Patient is somnolent but localizes the pain and opens eyes spontaneously.  She garbles words.  Patient still likely sedated from Versed.  Basic labs ordered and head and neck CT ordered given possible unwitnessed fall.  Patient loaded with IV Keppra.  Patient with no significant electrolyte abnormality, kidney injury, anemia.  Pregnancy test negative.  Head and neck CT unremarkable.    Upon re-evaluation patient with more seizure-like activity with clenched jaw and staring off spells.  Patient was given 1 mg IV Ativan. Seizure-like activity resolved.  She did not localize the pain or open her eyes to ammonia tablets during seizure like event.  Concern for pseudoseizure versus true seizure.  Given multiple ED visits, neurology was consulted and Dr. Laurence SlateAroor recommends admission [facility] for video EEG. Had normal MRI this past week. Patient remained hemodynamically stable throughout my care and admitted to hospital service for further  care.  Final Clinical Impressions(s) / ED Diagnoses   Final diagnoses:  Seizure-like activity [facility](HCC)    ED Discharge Orders    None       Virgina NorfolkCuratolo, Christianne Zacher, DO 11/15/17 1934

## 2017-11-15 NOTE — Consult Note (Signed)
Neurology Consultation  Reason for Consult: Seizures Referring Physician: Dr. Pearson GrippeJames Kim  CC: Seizures  History is obtained from: Chart, patient  HPI: Audrey Arnold is a 22 y.o. female  Past medical history of seizures that have been happening for the last 2 years, with multiple visits to the emergency room over the past few weeks, started on Keppra recently, shortness compliance to medication, presented to the emergency room with seizure activity at school today. 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  She had reportedly 3 episodes of intermittent jerking and a 5-minute..  She has received Versed multiple times today and also exhibited seizure activity described as arching of her back but generalized body shaking that happened [facility]. Patient is very somnolent at this time but able to provide some history and is able to cooperate with the exam. She said that she had been diagnosed with seizures 2 years ago and was related to her birth control.  That time she had probably received a full work-up as documented in 1 of the ED notes but I do not have any other notes to see what all was done at the time. In 1 of the ED notes, she did endorse some anxiety but no other pertinent positives in the review of system. She has been to the emergency room 3 times prior to today's visit in the last 1 week. Because of the multiplicity of events and repeated ER visits in spite of being on Keppra that was started a few days ago, [facility] was [facility]  for inpatient neurology consultation and possible long-term EEG. She did receive a load of IV Keppra in [facility] emergency room [facility].  WUJ:[id number]:Unable to reliably obtain due to altered mental status.   Past Medical History:  Diagnosis Date  . Migraine   . Seizures (HCC)     Family History  Problem Relation Age of Onset   . Lupus Mother   . Diverticulitis Mother   . Hypertension Father    Social History:   reports that she has never smoked. She has never used smokeless tobacco. She reports that she does not drink alcohol. Her drug history is not on file.  Medications  Current Facility-Administered Medications:  .  0.9 %  sodium chloride infusion, , Intravenous, Continuous, Pearson GrippeKim, James, MD .  acetaminophen (TYLENOL) tablet 650 mg, 650 mg, Oral, Q6H PRN **OR** acetaminophen (TYLENOL) suppository 650 mg, 650 mg, Rectal, Q6H PRN, Pearson GrippeKim, James, MD .  enoxaparin (LOVENOX) injection 40 mg, 40 mg, Subcutaneous, Q24H, Pearson GrippeKim, James, MD  Exam: Current vital signs: BP 113/82 (BP Location: Left Arm)   Pulse 87   Temp 98.8 F (37.1 C) (Oral)   Resp 19   SpO2 100%  Vital signs in last 24 hours: Temp:  [98.8 F (37.1 C)] 98.8 F (37.1 C) (08/30 2035) Pulse Rate:  [85-101] 87 (08/30 2149) Resp:  [16-19] 19 (08/30 2149) BP: (113-135)/(72-89) 113/82 (08/30 2149) SpO2:  [100 %] 100 % (08/30 2149) General: Patient is very drowsy, but awakens to voice on repeated attempts, in no apparent distress. HEENT: Normocephalic atraumatic, dry oral mucous membranes, no evidence of tongue bite. Lungs: Clear to auscultation Cardiovascular: S1-S2 heard, regular rate rhythm Abdomen: Soft nondistended nontender Extremities: Warm and well-perfused with intact pulses. Neurological exam Next line she is drowsy, but awakens to voice. She follows all commands Her speech is  just slow but not dysarthric.  Attention concentration is reduced due to sedation. Naming comprehension and repetition are intact. Cranial nerves: Pupils are equal round reactive to light, extraocular movements intact, visual fields full to threat, face is symmetric, palate elevates midline, shoulder shrug intact, tongue midline. Motor exam: She is able to move all 4 extremities antigravity but not able to sustain them as she keeps falling asleep during this encounter.   No focal weakness noted. Sensory exam: Intact to touch all over Did not perform finger-nose-finger test for me due to inattention. Normal 2+ reflexes all over.  Labs I have reviewed labs in epic and the results pertinent to this consultation are: CBC    Component Value Date/Time   WBC 8.2 11/15/2017 1646   RBC 4.36 11/15/2017 1646   HGB 13.2 11/15/2017 1646   HCT 39.6 11/15/2017 1646   PLT 248 11/15/2017 1646   MCV 90.8 11/15/2017 1646   MCH 30.3 11/15/2017 1646   MCHC 33.3 11/15/2017 1646   RDW 13.1 11/15/2017 1646   LYMPHSABS 2.1 11/15/2017 1646   MONOABS 0.6 11/15/2017 1646   EOSABS 0.1 11/15/2017 1646   BASOSABS 0.0 11/15/2017 1646   CMP     Component Value Date/Time   NA 142 11/15/2017 1646   K 4.1 11/15/2017 1646   CL 107 11/15/2017 1646   CO2 27 11/15/2017 1646   GLUCOSE 75 11/15/2017 1646   BUN 15 11/15/2017 1646   CREATININE 0.89 11/15/2017 1646   CALCIUM 9.3 11/15/2017 1646   PROT 7.2 11/12/2017 1512   ALBUMIN 4.2 11/12/2017 1512   AST 23 11/12/2017 1512   ALT 15 11/12/2017 1512   ALKPHOS 70 11/12/2017 1512   BILITOT 0.7 11/12/2017 1512   GFRNONAA >60 11/15/2017 1646   GFRAA >60 11/15/2017 1646   Imaging I have reviewed the images obtained: MRI examination of the brain performed yesterday is normal.  Assessment:  22 year old woman with past history of seizures that started 2 years ago for which she had full work-up and had been seizure-free since about until 2 weeks ago when she started having more seizure activity, brought in for further evaluation. She has had multiple visits to the ER with seizures. Work-up so far in terms of labs and imaging including MRI of the brain has been unremarkable. Description of her seizures has been inconsistent and what ever I could gather from my personal conversations with the ED staff over these days as well as notes seems to raise some flags about this being pseudoseizures.  That said, she has not had a complete  work-up here-the EEG has not been done yet. She has been compliant to her Keppra that was started a couple days ago in the emergency room. At this time, I would continue to monitor clinically and get a routine EEG in the morning. Full recommendations below  Impression: Evaluate for seizures Likelihood of psychogenic nonepileptic seizures raised based on the history and prior visits to the ER  Recommendations: Maintain seizure precautions Already loaded with Keppra. Keppra 500 twice daily from tomorrow morning. If has other seizures, please call neurology. I would like to avoid giving her benzodiazepines for every event that happens unless she has seizures lasting longer than 5 minutes. Will consider loading her with Dilantin if seizures happen again and at that time emergently will attempt to call the EEG technicians for long-term EEG. Otherwise routine EEG in the morning In spite of these multiple visits, no urinary toxicology screen has been obtained.  Obtain  urinary toxicology screen.  When she is more awake, would also like to get history of any psychological stressors as well as substance abuse.  -- Milon Dikes, MD [facility] Pager: [phone] If 7pm to 7am, please call on call as listed on AMION.

## 2017-11-15 NOTE — ED Triage Notes (Signed)
Pt with diagnosis of seizures has been seen multiple times for seizure activity in the past week. Today was witness to have seizure activity described as "3 episodes of intermittent jerking in a 5 minute period." EMS reports that they believe the pt has been taking medication as prescribed for seizures. Pt is Consulting civil engineerstudent at [employer].

## 2017-11-15 NOTE — Progress Notes (Signed)
Pt arrived on units via stretcher with care link at approx. 2145, immediately on arrival to room, client started having seizure like activity, client arched back and stiffened neck, after this client began to have twitching in arms and legs, as well as some spastic movements in the arms, each episode lasted only about 30 sec, however, client had multiple episodes during transfer to room. Care link administered 2.5mg  of versed by IV. Pt limitedly responsive after versed, however, VS stable,  notified Neurology MD and [facility] provider of activity. Jorge Nyimothy S Veida Spira

## 2017-11-16 ENCOUNTER — Other Ambulatory Visit: Payer: Self-pay

## 2017-11-16 ENCOUNTER — Observation Stay (HOSPITAL_COMMUNITY): Payer: BLUE CROSS/BLUE SHIELD

## 2017-11-16 DIAGNOSIS — Z79899 Other long term (current) drug therapy: Secondary | ICD-10-CM | POA: Diagnosis not present

## 2017-11-16 DIAGNOSIS — R569 Unspecified convulsions: Secondary | ICD-10-CM | POA: Diagnosis not present

## 2017-11-16 DIAGNOSIS — E039 Hypothyroidism, unspecified: Secondary | ICD-10-CM | POA: Diagnosis not present

## 2017-11-16 LAB — COMPREHENSIVE METABOLIC PANEL
ALBUMIN: 3.7 g/dL (ref 3.5–5.0)
ALK PHOS: 67 U/L (ref 38–126)
ALT: 12 U/L (ref 0–44)
AST: 18 U/L (ref 15–41)
Anion gap: 8 (ref 5–15)
BILIRUBIN TOTAL: 0.6 mg/dL (ref 0.3–1.2)
BUN: 11 mg/dL (ref 6–20)
CALCIUM: 9 mg/dL (ref 8.9–10.3)
CO2: 25 mmol/L (ref 22–32)
CREATININE: 0.83 mg/dL (ref 0.44–1.00)
Chloride: 107 mmol/L (ref 98–111)
GFR calc Af Amer: >60 mL/min (ref >60)
GLUCOSE: 89 mg/dL (ref 70–99)
Potassium: 4 mmol/L (ref 3.5–5.1)
Sodium: 140 mmol/L (ref 135–145)
TOTAL PROTEIN: 6.7 g/dL (ref 6.5–8.1)

## 2017-11-16 LAB — CK TOTAL AND CKMB (NOT AT ARMC)
CK, MB: 0.7 ng/mL (ref 0.5–5.0)
Relative Index: INVALID (ref 0.0–2.5)
Total CK: 42 U/L (ref 38–234)

## 2017-11-16 LAB — CBC
HCT: 41.5 % (ref 36.0–46.0)
HEMOGLOBIN: 13.1 g/dL (ref 12.0–15.0)
MCH: 29.6 pg (ref 26.0–34.0)
MCHC: 31.6 g/dL (ref 30.0–36.0)
MCV: 93.9 fL (ref 78.0–100.0)
Platelets: 226 10*3/uL (ref 150–400)
RBC: 4.42 MIL/uL (ref 3.87–5.11)
RDW: 12.8 % (ref 11.5–15.5)
WBC: 6.4 10*3/uL (ref 4.0–10.5)

## 2017-11-16 LAB — TSH: TSH: 2.031 u[IU]/mL (ref 0.350–4.500)

## 2017-11-16 MED ORDER — LORAZEPAM 2 MG/ML IJ SOLN
1.0000 mg | INTRAMUSCULAR | Status: AC
  Administered 2017-11-16: 1 mg via INTRAVENOUS
  Filled 2017-11-16: qty 1

## 2017-11-16 NOTE — Progress Notes (Signed)
Progress Note    Audrey Arnold  WUJ:[id number] DOB: [birth year]  DOA: 11/15/2017 PCP: Patient, No Pcp Per    Brief Narrative:     Medical records reviewed and are as summarized below:  Audrey Arnold is an 22 y.o. female w hypothyroidism and h/o seizure do.  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 Doesn't recall if bit tongue , or incontinence.   EMS gave her versed.  4th episode in 3 days.  Pt notes initially started to have seizure about 2.5 years ago and was due to "implanted birth control".  Assessment/Plan:   Active Problems:   Seizure (HCC)  Seizures Seizure precautions MRI brain 11/12/2017=> no acute process 4 recent ER visits Overnight video EEG ordered keppra 1000mg  iv x1 in ED and then keppra 500mg  po bid Neurology consulted- plan for overnight EEG -UDS -labs pending: AM labs plus prolactin, HIV, CK, TSH -under stress with school  Hypothyroidism -on synthroid   Family Communication/Anticipated D/C date and plan/Code Status   DVT prophylaxis:  Code Status: Full Code.  Family Communication: friend at bedside Disposition Plan: pending EEG   Medical Consultants:    Neuro  Subjective:   Goes to school at [employer] for [employer]inesiology-- wants to be a PT Under a lot of NEW stress  Objective:    Vitals:   11/16/17 0028 11/16/17 0229 11/16/17 0424 11/16/17 0432  BP:  110/70  (!) 98/59  Pulse:    (!) 59  Resp:  18  17  Temp:      TempSrc:      SpO2:    98%  Weight: 61 kg  63 kg   Height: 5\' 4"  (1.626 m)       Intake/Output Summary (Last 24 hours) at 11/16/2017 0808 Last data filed at 11/16/2017 0400 Gross per 24 hour  Intake 191.46 ml  Output -  Net 191.46 ml   Filed Weights   11/16/17 0028 11/16/17 0424  Weight: 61 kg 63 kg    Exam: In bed, seizure pads on side rails, NAD No increased work of breathing  No LE edema rrr Alert and oriented  Data Reviewed:   I have personally reviewed following labs and imaging studies:  Labs: Labs show the following:    Basic Metabolic Panel: Recent Labs  Lab 11/11/17 2348 11/12/17 1512 11/15/17 1646  NA 140 142 142  K 4.4 4.0 4.1  CL 108 108 107  CO2 27 27 27   GLUCOSE 86 84 75  BUN 16 17 15   CREATININE 0.90 0.83 0.89  CALCIUM 8.7* 9.3 9.3   GFR Estimated Creatinine Clearance: 87.1 mL/min (by C-G formula based on SCr of 0.89 mg/dL). Liver Function Tests: Recent Labs  Lab 11/12/17 1512  AST 23  ALT 15  ALKPHOS 70  BILITOT 0.7  PROT 7.2  ALBUMIN 4.2   No results for input(s): LIPASE, AMYLASE in the last 168 hours. No results for input(s): AMMONIA in the last 168 hours. Coagulation profile No results for input(s): INR, PROTIME in the last 168 hours.  CBC: Recent Labs  Lab 11/11/17 2348 11/12/17 1512 11/15/17 1646  WBC 10.0 8.4 8.2  NEUTROABS 6.5 6.2 5.5  HGB 12.4 13.2 13.2  HCT 38.7 39.8 39.6  MCV 94.2 91.1 90.8  PLT 207 247 248   Cardiac Enzymes: No results for input(s): CKTOTAL, CKMB, CKMBINDEX, TROPONINI in the last 168 hours. BNP (last 3 results) No results for input(s): PROBNP in the last 8760 hours. CBG: Recent Labs  Lab 11/11/17 2347 11/12/17 1443  11/15/17 1633 11/15/17 2149  GLUCAP 85 97 83 83   D-Dimer: No results for input(s): DDIMER in the last 72 hours. Hgb A1c: No results for input(s): HGBA1C in the last 72 hours. Lipid Profile: No results for input(s): CHOL, HDL, LDLCALC, TRIG, CHOLHDL, LDLDIRECT in the last 72 hours. Thyroid function studies: No results for input(s): TSH, T4TOTAL, T3FREE, THYROIDAB in the last 72 hours.  Invalid input(s): FREET3 Anemia work up: No results for input(s): VITAMINB12, FOLATE, FERRITIN, TIBC, IRON, RETICCTPCT in the last 72 hours. Sepsis Labs: Recent Labs  Lab 11/11/17 2348 11/12/17 1512 11/15/17 1646  WBC 10.0 8.4 8.2    Microbiology No results found for this or any previous visit (from the past 240 hour(s)).  Procedures and diagnostic studies:  Ct Head Wo Contrast  Result Date: 11/15/2017 CLINICAL  DATA:  Multiple seizures over the past week with 3 witnessed seizures in a 5 minutes. Today. EXAM: CT HEAD WITHOUT CONTRAST CT CERVICAL SPINE WITHOUT CONTRAST TECHNIQUE: Multidetector CT imaging of the head and cervical spine was performed following the standard protocol without intravenous contrast. Multiplanar CT image reconstructions of the cervical spine were also generated. COMPARISON:  MRI head 11/12/2017 FINDINGS: CT HEAD FINDINGS BRAIN: The ventricles and sulci are normal. No intraparenchymal hemorrhage, mass effect nor midline shift. No acute large vascular territory infarcts. No abnormal extra-axial fluid collections. Basal cisterns are midline and not effaced. No acute brainstem nor cerebellar abnormality. VASCULAR: Unremarkable. SKULL/SOFT TISSUES: No skull fracture. No significant soft tissue swelling. ORBITS/SINUSES: The included ocular globes and orbital contents are normal.The mastoid air-cells and included paranasal sinuses are well-aerated. OTHER: None. CT CERVICAL SPINE FINDINGS ALIGNMENT: Vertebral bodies in alignment. Maintained lordosis. SKULL BASE AND VERTEBRAE: Cervical vertebral bodies and posterior elements are intact. Intervertebral disc heights preserved. No destructive bony lesions. C1-2 articulation maintained. SOFT TISSUES AND SPINAL CANAL: Normal. DISC LEVELS: No significant osseous canal stenosis or neural foraminal narrowing. UPPER CHEST: Lung apices are clear. OTHER: None. IMPRESSION: Normal CT appearance of the brain. No acute cervical spine fracture or listhesis. Electronically Signed   By: Tollie Eth M.D.   On: 11/15/2017 18:26   Ct Cervical Spine Wo Contrast  Result Date: 11/15/2017 CLINICAL DATA:  Multiple seizures over the past week with 3 witnessed seizures in a 5 minutes. Today. EXAM: CT HEAD WITHOUT CONTRAST CT CERVICAL SPINE WITHOUT CONTRAST TECHNIQUE: Multidetector CT imaging of the head and cervical spine was performed following the standard protocol without  intravenous contrast. Multiplanar CT image reconstructions of the cervical spine were also generated. COMPARISON:  MRI head 11/12/2017 FINDINGS: CT HEAD FINDINGS BRAIN: The ventricles and sulci are normal. No intraparenchymal hemorrhage, mass effect nor midline shift. No acute large vascular territory infarcts. No abnormal extra-axial fluid collections. Basal cisterns are midline and not effaced. No acute brainstem nor cerebellar abnormality. VASCULAR: Unremarkable. SKULL/SOFT TISSUES: No skull fracture. No significant soft tissue swelling. ORBITS/SINUSES: The included ocular globes and orbital contents are normal.The mastoid air-cells and included paranasal sinuses are well-aerated. OTHER: None. CT CERVICAL SPINE FINDINGS ALIGNMENT: Vertebral bodies in alignment. Maintained lordosis. SKULL BASE AND VERTEBRAE: Cervical vertebral bodies and posterior elements are intact. Intervertebral disc heights preserved. No destructive bony lesions. C1-2 articulation maintained. SOFT TISSUES AND SPINAL CANAL: Normal. DISC LEVELS: No significant osseous canal stenosis or neural foraminal narrowing. UPPER CHEST: Lung apices are clear. OTHER: None. IMPRESSION: Normal CT appearance of the brain. No acute cervical spine fracture or listhesis. Electronically Signed   By: Tollie Eth M.D.   On:  11/15/2017 18:26    Medications:   . enoxaparin (LOVENOX) injection  40 mg Subcutaneous Q24H  . levETIRAcetam  500 mg Oral BID  . levothyroxine  50 mcg Oral QAC breakfast   Continuous Infusions:   LOS: 0 days   Joseph Art  [facility]   *Please refer to [email or web address], password [email or web address] to get updated schedule on who will round on this patient, as hospitalists switch teams weekly. If 7PM-7AM, please contact night-coverage at [email or web address], password [email or web address] for any overnight needs.  11/16/2017, 8:08 AM

## 2017-11-16 NOTE — Progress Notes (Signed)
Reported off to oncoming [employer] Brooke. Dionne BucyP. Amo Haydee Jabbour RN

## 2017-11-16 NOTE — Progress Notes (Signed)
LTM EEG started 

## 2017-11-16 NOTE — Progress Notes (Signed)
Pt family called the front to report pt c/o chest pain; then said pt having active seizure. Dr. Benjamine MolaVann notified; Rn walked in to pt room and found her having tonic clonic seizure; pt heart rate went up to 140's, oxygen dropped to the 70's but up to 100%on 2 L oxygen; pt had some coughing and spitting as well; ordered ativan dose given; VSS; NP Jessica from neurology notified. Seizure activity last about 2mins; pt open eyes but in and out post ictal. No incontinent episode noted; Pt sleeping and EKG at bedside. Will continue to monitor. Dionne BucyP. Amo Frederic Tones RN

## 2017-11-16 NOTE — Progress Notes (Addendum)
NEURO HOSPITALIST PROGRESS NOTE   Subjective: Patient awake, alert, eyes open in bed. NAD. Reports not having any more seizure like episodes overnight. I asked about her stressors. 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 She is falling behind in her classes and her duties d/t being in the hospital 3 times this week. Again denies ever being on any medications for history of seizures. Denies missing any keppra doses since being prescribed Keppra on Tuesday.  Exam: Vitals:   11/16/17 0229 11/16/17 0432  BP: 110/70 (!) 98/59  Pulse:  (!) 59  Resp: 18 17  Temp:    SpO2:  98%    Physical Exam   HEENT-  Normocephalic, no lesions, without obvious abnormality.  Normal external eye and conjunctiva.   Cardiovascular- S1-S2 audible, pulses palpable throughout   Lungs-lungs clear to auscultation bilaterally.   Saturations within normal limits on RA Extremities- Warm, dry and intact Musculoskeletal-no joint tenderness, deformity or swelling Skin-warm and dry, no hyperpigmentation, vitiligo, or suspicious lesions   Neuro:  Mental Status: Alert, oriented, thought content appropriate.  Speech fluent without evidence of aphasia.  Able to follow commands without difficulty. Cranial Nerves: II: Visual fields grossly normal,  III,IV, VI: ptosis not present, extra-ocular motions intact bilaterally pupils equal, round, reactive to light and accommodation V,VII: smile symmetric, facial light touch sensation normal bilaterally VIII: hearing normal bilaterally IX,X: uvula rises symmetrically XI: bilateral shoulder shrug XII: midline tongue extension Motor: Right : Upper extremity   5/5    Left:     Upper extremity   5/5  Lower extremity   5/5     Lower extremity   5/5 Tone and bulk:normal tone throughout; no atrophy noted Sensory: Pinprick and light touch intact throughout, bilaterally Deep Tendon Reflexes: 2+ and symmetric biceps, patella Plantars: Right:  downgoing   Left: downgoing Cerebellar: normal finger-to-nose, normal rapid alternating movements and normal heel-to-shin test Gait: deferred    Medications:  Scheduled: . enoxaparin (LOVENOX) injection  40 mg Subcutaneous Q24H  . levETIRAcetam  500 mg Oral BID  . levothyroxine  50 mcg Oral QAC breakfast   Continuous:  VHQ:[id number] **OR** acetaminophen  Pertinent Labs/Diagnostics:   Ct Head Wo Contrast  Result Date: 11/15/2017 CLINICAL DATA:  Multiple seizures over the past week with 3 witnessed seizures in a 5 minutes. Today. EXAM: CT HEAD WITHOUT CONTRAST CT CERVICAL SPINE WITHOUT CONTRAST TECHNIQUE: Multidetector CT imaging of the head and cervical spine was performed following the standard protocol without intravenous contrast. Multiplanar CT image reconstructions of the cervical spine were also generated. COMPARISON:  MRI head 11/12/2017 FINDINGS: CT HEAD FINDINGS BRAIN: The ventricles and sulci are normal. No intraparenchymal hemorrhage, mass effect nor midline shift. No acute large vascular territory infarcts. No abnormal extra-axial fluid collections. Basal cisterns are midline and not effaced. No acute brainstem nor cerebellar abnormality. VASCULAR: Unremarkable. SKULL/SOFT TISSUES: No skull fracture. No significant soft tissue swelling. ORBITS/SINUSES: The included ocular globes and orbital contents are normal.The mastoid air-cells and included paranasal sinuses are well-aerated. OTHER: None. CT CERVICAL SPINE FINDINGS ALIGNMENT: Vertebral bodies in alignment. Maintained lordosis. SKULL BASE AND VERTEBRAE: Cervical vertebral bodies and posterior elements are intact. Intervertebral disc heights preserved. No destructive bony lesions. C1-2 articulation maintained. SOFT TISSUES AND SPINAL CANAL: Normal. DISC LEVELS: No significant osseous canal stenosis or neural foraminal narrowing. UPPER CHEST: Lung apices are clear.  OTHER: None. IMPRESSION: Normal CT appearance of the brain. No  acute cervical spine fracture or listhesis. Electronically Signed   By: Tollie Ethavid  Kwon M.D.   On: 11/15/2017 18:26   Ct Cervical Spine Wo Contrast  Result Date: 11/15/2017 CLINICAL DATA:  Multiple seizures over the past week with 3 witnessed seizures in a 5 minutes. Today. EXAM: CT HEAD WITHOUT CONTRAST CT CERVICAL SPINE WITHOUT CONTRAST TECHNIQUE: Multidetector CT imaging of the head and cervical spine was performed following the standard protocol without intravenous contrast. Multiplanar CT image reconstructions of the cervical spine were also generated. COMPARISON:  MRI head 11/12/2017 FINDINGS: CT HEAD FINDINGS BRAIN: The ventricles and sulci are normal. No intraparenchymal hemorrhage, mass effect nor midline shift. No acute large vascular territory infarcts. No abnormal extra-axial fluid collections. Basal cisterns are midline and not effaced. No acute brainstem nor cerebellar abnormality. VASCULAR: Unremarkable. SKULL/SOFT TISSUES: No skull fracture. No significant soft tissue swelling. ORBITS/SINUSES: The included ocular globes and orbital contents are normal.The mastoid air-cells and included paranasal sinuses are well-aerated. OTHER: None. CT CERVICAL SPINE FINDINGS ALIGNMENT: Vertebral bodies in alignment. Maintained lordosis. SKULL BASE AND VERTEBRAE: Cervical vertebral bodies and posterior elements are intact. Intervertebral disc heights preserved. No destructive bony lesions. C1-2 articulation maintained. SOFT TISSUES AND SPINAL CANAL: Normal. DISC LEVELS: No significant osseous canal stenosis or neural foraminal narrowing. UPPER CHEST: Lung apices are clear. OTHER: None. IMPRESSION: Normal CT appearance of the brain. No acute cervical spine fracture or listhesis. Electronically Signed   By: Tollie Ethavid  Kwon M.D.   On: 11/15/2017 18:26   Assessment: 22 year old woman with past history of seizures that started 2 years ago for which she had full work-up and had been seizure-free  until 2 weeks ago when she  started having more seizure activity, brought in for further evaluation.She has had multiple visits to the ER with seizures. Work-up so far in terms of labs and imaging including MRI of the brain has been unremarkable. Description of her seizures has been inconsistent and what ever I could gather from my personal conversations with the ED staff over these days as well as notes seems to raise some flags about this being pseudoseizures.  That said, she has not had a complete work-up here-the EEG has not been done yet. She has been compliant with Keppra that was started a couple days ago in the emergency room. At this time, I would continue to monitor clinically and start cEEG.  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 --LTM -I would like to avoid giving her benzodiazepines for every event that happens unless she has seizures lasting longer than 5 minutes. -Will consider loading her with Dilantin if seizures happen again. -In spite of these multiple visits, no urinary toxicology screen has been obtained.  Obtain urinary toxicology screen.  When she is more awake, would also like to get history of any psychological stressors as well as substance abuse.  Valentina LucksJessica Williams, MSN, NP-C [facility] [phone]  Attending neurologist's note to follow    11/16/2017, 8:09 AM     NEUROHOSPITALIST ADDENDUM Performed a face to face diagnostic evaluation.  I have reviewed the contents of history and physical exam as documented by PA/ARNP/Resident and agree with above documentation.  I have discussed and formulated the above plan as documented. Edits to the note have been  made as needed.  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  Official read pending.  Key exam findings: Normal neurological exam  Plan: continue EEG overnight, if read tomorrow confirms non epileptic spells, can be discharged     Sushanth Aroor MD [facility] [phone]   If 7pm to 7am, please call on call as listed on AMION.

## 2017-11-16 NOTE — Progress Notes (Addendum)
Called to bedside for seizure like activity. Captured by LTM- pending official read.   Per nurse family called for CP and RN noted  heart rate in the 140's. Patient's friends that were in the room pressed the button. Nurse ( priscilla) witnessed a portion of the seizure. Described it as a tonic clonic seizure, no incontinence. She did not give ativan. Patient did return to baseline. Whole episode including post- ictal phase lasted about 2.5 minutes. Patients eyes were closed.  Assessment: patient not oriented and unable to answer questions. Just moaning noted. Pupils: PERRLA. No gaze deviation noted. NAD on 2L [place]. LTM in place. No tongue bites. No loss of bowel or bladder.   Received another phone call about 5 minutes after I left the floor and patient had a second seizure. 1 mg ativan was given. Seizure stopped.    Valentina LucksJessica Williams, MSN, NP-C [facility] [phone]

## 2017-11-16 NOTE — Progress Notes (Signed)
RN was called to pt's room d/t pt having seizures; pt noted to be shaking, tonic clonic seizure, spitting out; HR elevated to 130's and oxygen dropped to 70's, pt placed on 2L oxygen; seizure was intermittent lasting about 5 mins; attending Dr. Benjamine MolaVann and neurology notified; pt post ictal lasted 2mins; pt responsive and able to say her name when asked. VSS: call light within reach and family remain at bedside. Will continue to closely monitor pt. Dionne BucyP. Amo Tanee Henery RN

## 2017-11-17 DIAGNOSIS — R569 Unspecified convulsions: Secondary | ICD-10-CM | POA: Diagnosis not present

## 2017-11-17 LAB — RAPID URINE DRUG SCREEN, HOSP PERFORMED
Amphetamines: NOT DETECTED
Barbiturates: NOT DETECTED
Benzodiazepines: POSITIVE — AB
COCAINE: NOT DETECTED
OPIATES: NOT DETECTED
TETRAHYDROCANNABINOL: NOT DETECTED

## 2017-11-17 LAB — HIV ANTIBODY (ROUTINE TESTING W REFLEX): HIV SCREEN 4TH GENERATION: NONREACTIVE

## 2017-11-17 LAB — PROLACTIN: Prolactin: 37.2 ng/mL — ABNORMAL HIGH (ref 4.8–23.3)

## 2017-11-17 NOTE — Progress Notes (Signed)
D/C instructions provided to patient, denies questions/concerns at this time. Patient transported to front entrance via WC, tol well. 

## 2017-11-17 NOTE — Plan of Care (Addendum)
Patient stable, discussed POC with patient, agreeable with plan, denies question/concerns at this time.  
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
## 2017-11-17 NOTE — Progress Notes (Addendum)
NEURO HOSPITALIST PROGRESS NOTE   Subjective: Patient in bed asleep eyes closed. NAD on 2L [place]. Since the two episodes of seizure like activity yesterday no other events have occurred. Official LTM read pending. Patient complains of HA on posterior head since Friday, but describes it as more of a "soreness like she hit her head". Complains of CP in  The center of her chest over her sternum, that she states feels like" her chest is caving in, like tightness" then proceeds to say it does not help when nurses are always pressing there. Her main stressor is school. She is especially concerned that she is falling behind in school b/c of multiple hospital trips d/ t seizures.  Exam: Vitals:   11/17/17 0430 11/17/17 0444  BP:  100/66  Pulse: 62 61  Resp: 16 14  Temp:    SpO2: 100% 100%    Physical Exam   HEENT-  Normocephalic, no lesions, without obvious abnormality.  Normal external eye and conjunctiva.   Cardiovascular- , pulses palpable throughout   Lungs- Saturations within normal limits on 2L [place] Extremities- Warm, dry and intact Musculoskeletal-no joint tenderness, deformity or swelling Skin-warm and dry, no hyperpigmentation, vitiligo, or suspicious lesions  Neuro:  Mental Status: Alert, oriented, thought content appropriate.  Speech fluent without evidence of aphasia.  Able to follow commands without difficulty. Cranial Nerves: II: Visual fields grossly normal,  III,IV, VI: ptosis not present, extra-ocular motions intact bilaterally pupils equal, round, reactive to light and accommodation V,VII: smile symmetric, facial light touch sensation normal bilaterally VIII: hearing normal bilaterally IX,X: uvula rises symmetrically XI: bilateral shoulder shrug XII: midline tongue extension Motor: Right : Upper extremity   5/5    Left:     Upper extremity   5/5  Lower extremity   5/5     Lower extremity   5/5 Tone and bulk:normal tone throughout; no atrophy  noted Sensory: Pinprick and light touch intact throughout, bilaterally Deep Tendon Reflexes: 2+ and symmetric biceps, patella Plantars: Right: downgoing   Left: downgoing Cerebellar: normal finger-to-nose, normal rapid alternating movements and normal heel-to-shin test Gait: deferred    Medications:  Scheduled: . enoxaparin (LOVENOX) injection  40 mg Subcutaneous Q24H  . levETIRAcetam  500 mg Oral BID  . levothyroxine  50 mcg Oral QAC breakfast   Continuous:  HYW:[id number] **OR** acetaminophen  Pertinent Labs/Diagnostics: cEEG day 1:  Clinical interpretation: This day 1 of intensive EEG monitoring with simultaneous video monitoring recorded 3 stereotypical events of interest as discussed above marked by shaking back arching bicycle and unresponsiveness.  There was no EEG correlate to suggest seizures.  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     Ct Head Wo Contrast  Result Date: 11/15/2017 CLINICAL DATA:  Multiple seizures over the past week with 3 witnessed seizures in a 5 minutes. Today. EXAM: CT HEAD WITHOUT CONTRAST CT CERVICAL SPINE WITHOUT CONTRAST TECHNIQUE: Multidetector CT imaging of the head and cervical spine was performed following the standard protocol without intravenous contrast. Multiplanar CT image reconstructions of the cervical spine were also generated. COMPARISON:  MRI head 11/12/2017 FINDINGS: CT HEAD FINDINGS BRAIN: The ventricles and sulci are normal. No intraparenchymal hemorrhage, mass effect nor midline shift. No acute large vascular territory infarcts. No abnormal extra-axial fluid collections. Basal cisterns are midline and not effaced. No acute brainstem nor cerebellar abnormality. VASCULAR: Unremarkable. SKULL/SOFT TISSUES: No  skull fracture. No significant soft tissue swelling. ORBITS/SINUSES: The included ocular globes and orbital contents are normal.The mastoid air-cells and included paranasal sinuses are well-aerated.  OTHER: None. CT CERVICAL SPINE FINDINGS ALIGNMENT: Vertebral bodies in alignment. Maintained lordosis. SKULL BASE AND VERTEBRAE: Cervical vertebral bodies and posterior elements are intact. Intervertebral disc heights preserved. No destructive bony lesions. C1-2 articulation maintained. SOFT TISSUES AND SPINAL CANAL: Normal. DISC LEVELS: No significant osseous canal stenosis or neural foraminal narrowing. UPPER CHEST: Lung apices are clear. OTHER: None. IMPRESSION: Normal CT appearance of the brain. No acute cervical spine fracture or listhesis. Electronically Signed   By: Tollie Eth M.D.   On: 11/15/2017 18:26   Ct Cervical Spine Wo Contrast  Result Date: 11/15/2017 CLINICAL DATA:  Multiple seizures over the past week with 3 witnessed seizures in a 5 minutes. Today. EXAM: CT HEAD WITHOUT CONTRAST CT CERVICAL SPINE WITHOUT CONTRAST TECHNIQUE: Multidetector CT imaging of the head and cervical spine was performed following the standard protocol without intravenous contrast. Multiplanar CT image reconstructions of the cervical spine were also generated. COMPARISON:  MRI head 11/12/2017 FINDINGS: CT HEAD FINDINGS BRAIN: The ventricles and sulci are normal. No intraparenchymal hemorrhage, mass effect nor midline shift. No acute large vascular territory infarcts. No abnormal extra-axial fluid collections. Basal cisterns are midline and not effaced. No acute brainstem nor cerebellar abnormality. VASCULAR: Unremarkable. SKULL/SOFT TISSUES: No skull fracture. No significant soft tissue swelling. ORBITS/SINUSES: The included ocular globes and orbital contents are normal.The mastoid air-cells and included paranasal sinuses are well-aerated. OTHER: None. CT CERVICAL SPINE FINDINGS ALIGNMENT: Vertebral bodies in alignment. Maintained lordosis. SKULL BASE AND VERTEBRAE: Cervical vertebral bodies and posterior elements are intact. Intervertebral disc heights preserved. No destructive bony lesions. C1-2 articulation  maintained. SOFT TISSUES AND SPINAL CANAL: Normal. DISC LEVELS: No significant osseous canal stenosis or neural foraminal narrowing. UPPER CHEST: Lung apices are clear. OTHER: None. IMPRESSION: Normal CT appearance of the brain. No acute cervical spine fracture or listhesis. Electronically Signed   By: Tollie Eth M.D.   On: 11/15/2017 18:26   Assessment: 22 year old woman with past history of seizures that started 2 years ago for which she had full work-up and had been seizure-free  until 2 weeks ago when she started having more seizure activity, brought in for further evaluation.She has had multiple visits to the ER with seizures. Work-up so far in terms of labs and imaging including MRI of the brain has been unremarkable. Description of her seizures has been inconsistent and what ever I could gather from my personal conversations with the ED staff over these days as well as notes seems to raise some flags about this being pseudoseizures.  She has been compliant with Keppra that was started a couple days ago in the emergency room. At this time, I would continue to monitor clinically and continue cEEG. Three seizure like episodes were captured 11/16/17 with no correlation on cEEG. May discharge.    This day 1 of intensive EEG monitoring with simultaneous video monitoring recorded 3 stereotypical events of interest as discussed above marked by shaking back arching bicycle and unresponsiveness.  There was no EEG correlate to suggest seizures.  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  Clinical correlation is advised.  Impression: Non epileptic spells  Recommendations:  D/C LTM EEG Will counsel patient regarding diagnosis.   Valentina Lucks, MSN, NP-C [facility] [phone]  Attending neurologist's note to follow    11/17/2017, 7:31 AM    NEUROHOSPITALIST ADDENDUM Performed a  face to face diagnostic evaluation.    I have reviewed the contents of  history and physical exam as documented by PA/ARNP/Resident and agree with above documentation.  I have discussed and formulated the above plan as documented. Edits to the note have been made as needed.   Impression: Non epileptic spells.  She had 3 stereotypical events characterized by back arching and having generalized shaking.  Discussed with mom and brothers in the room.  They felt the spells were similar to her episodes in the past.  Patient had ambulatory EEG which captured spells and she was told they were nonepileptic.  She has also been seizure-free for several years off antiepileptic and it was attributed to contraceptive.     She has been under more stress since starting college in managing her job at the same time. She has been to the ER 3 other times with these spells in the last week and has had multiple spells in spite of being on Keppra that was started few days ago.   Family states that they have been confused in the past as some neurologist would say continue seizure medication and others with state that these are nonepileptic and she does not need to be on seizure medications. I explained to them that some patients can have both epilepsy and psychogenic nonepileptic spells and therefore may have recommended her to be on antiepileptic.  Given this history, that her spells are likely stereotypical, has not responded to Keppra and to ambulate her EEGs capturing spells and not showing electrographic seizures, that there is no need to continue AEDs.  She will need cognitive behavioral therapy and outpatient appointment with a psychiatrist to help with her anxiety.      Georgiana Spinner Aroor MD [facility] [phone]   If 7pm to 7am, please call on call as listed on AMION.

## 2017-11-17 NOTE — Progress Notes (Signed)
Pt's BP dropped significantly over last 4 hours, notably 79SBP @ 0330; pt awakened, denies any sx other drowsiness, and BP rechecked. New results 100/66, paged on call provider though to inform them of above readings.

## 2017-11-17 NOTE — Progress Notes (Signed)
LTM EEG D/C'd per Dr Aroor 

## 2017-11-17 NOTE — Procedures (Signed)
Electroencephalography report.  Long-term monitoring.  Recording begins 8/31 2019 at 1123 Recording ends 11/17/2017 at 7:30 AM  CPT 95951 Day 1  Date acquisition: [employer] 10-20 for eligible placement.  Additional EKG channel  This EEG was requested to record events of interest to determine if these are seizures.  Patient presents with spells accompanied by shaking unresponsiveness  3 stereotypical events of interest were recorded.  Patient was awake prior to events onset.  She then falls back in the bed.  There is a bowlegs bicycle and pelvic thrusting back arching unresponsiveness.  Asynchronous arms and legs shaking.  Electrographically there was no EEG changes to suggest seizures.  Background activities marked by preserved posterior dominant waking rhythm prior during and after these events of interest.  These are not seizures and consistent with psychogenic nonepileptic spells  Otherwise interictal EEG was completely normal during wakefulness drowsiness and sleep.  There was no interictal epileptiform discharges clinical or subclinical seizures present.  Clinical interpretation: This day 1 of intensive EEG monitoring with simultaneous video monitoring recorded 3 stereotypical events of interest as discussed above marked by shaking back arching bicycle and unresponsiveness.  There was no EEG correlate to suggest seizures.  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  Clinical correlation is advised.

## 2017-11-20 ENCOUNTER — Ambulatory Visit: Payer: BLUE CROSS/BLUE SHIELD | Admitting: Diagnostic Neuroimaging

## 2017-11-21 ENCOUNTER — Encounter: Payer: Self-pay | Admitting: Diagnostic Neuroimaging

## 2017-11-29 ENCOUNTER — Other Ambulatory Visit: Payer: Self-pay

## 2017-11-29 ENCOUNTER — Emergency Department (HOSPITAL_COMMUNITY)
Admission: EM | Admit: 2017-11-29 | Discharge: 2017-11-29 | Disposition: A | Discharge status: Home / Self Care | Payer: BLUE CROSS/BLUE SHIELD | Attending: Emergency Medicine | Admitting: Emergency Medicine

## 2017-11-29 DIAGNOSIS — F445 Conversion disorder with seizures or convulsions: Secondary | ICD-10-CM | POA: Diagnosis not present

## 2017-11-29 DIAGNOSIS — R569 Unspecified convulsions: Secondary | ICD-10-CM | POA: Diagnosis present
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
## 2017-11-29 NOTE — ED Notes (Signed)
Bed: [id number] Expected date:  Expected time:  Means of arrival:  Comments: Seizures

## 2017-12-05 ENCOUNTER — Encounter (HOSPITAL_COMMUNITY): Payer: Self-pay

## 2017-12-05 ENCOUNTER — Other Ambulatory Visit: Payer: Self-pay

## 2017-12-05 ENCOUNTER — Emergency Department (HOSPITAL_COMMUNITY)
Admission: EM | Admit: 2017-12-05 | Discharge: 2017-12-05 | Disposition: A | Discharge status: Home / Self Care | Payer: BLUE CROSS/BLUE SHIELD | Attending: Emergency Medicine | Admitting: Emergency Medicine

## 2017-12-05 DIAGNOSIS — F4310 Post-traumatic stress disorder, unspecified: Secondary | ICD-10-CM | POA: Insufficient documentation

## 2017-12-05 DIAGNOSIS — R0789 Other chest pain: Secondary | ICD-10-CM | POA: Insufficient documentation

## 2017-12-05 DIAGNOSIS — R002 Palpitations: Secondary | ICD-10-CM | POA: Insufficient documentation

## 2017-12-05 DIAGNOSIS — R569 Unspecified convulsions: Secondary | ICD-10-CM | POA: Diagnosis present

## 2017-12-05 LAB — BASIC METABOLIC PANEL
Anion gap: 8 (ref 5–15)
BUN: 13 mg/dL (ref 6–20)
CALCIUM: 10 mg/dL (ref 8.9–10.3)
CO2: 28 mmol/L (ref 22–32)
CREATININE: 0.84 mg/dL (ref 0.44–1.00)
Chloride: 108 mmol/L (ref 98–111)
GFR calc Af Amer: >60 mL/min (ref >60)
GLUCOSE: 75 mg/dL (ref 70–99)
Potassium: 4.6 mmol/L (ref 3.5–5.1)
Sodium: 144 mmol/L (ref 135–145)

## 2017-12-05 LAB — CBC WITH DIFFERENTIAL/PLATELET
Basophils Absolute: 0 10*3/uL (ref 0.0–0.1)
Basophils Relative: 0 %
Eosinophils Absolute: 0 10*3/uL (ref 0.0–0.7)
Eosinophils Relative: 0 %
HEMATOCRIT: 41.8 % (ref 36.0–46.0)
Hemoglobin: 14 g/dL (ref 12.0–15.0)
LYMPHS PCT: 14 %
Lymphs Abs: 1.5 10*3/uL (ref 0.7–4.0)
MCH: 30.5 pg (ref 26.0–34.0)
MCHC: 33.5 g/dL (ref 30.0–36.0)
MCV: 91.1 fL (ref 78.0–100.0)
MONO ABS: 0.4 10*3/uL (ref 0.1–1.0)
Monocytes Relative: 4 %
NEUTROS ABS: 8.7 10*3/uL — AB (ref 1.7–7.7)
Neutrophils Relative %: 82 %
Platelets: 256 10*3/uL (ref 150–400)
RBC: 4.59 MIL/uL (ref 3.87–5.11)
RDW: 12.9 % (ref 11.5–15.5)
WBC: 10.6 10*3/uL — ABNORMAL HIGH (ref 4.0–10.5)
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
## 2017-12-05 NOTE — ED Triage Notes (Signed)
[facility]. According to mom, patient does not have seizure hx, this is a reaction to a stressor and she can usually talk her out of them. Patient is  Now awake, alert and oriented.

## 2017-12-05 NOTE — ED Notes (Signed)
[facility] Expected date:  Expected time:  Means of arrival:  Comments: EMS-seizures/move to Room 8 when discharged

## 2017-12-18 ENCOUNTER — Ambulatory Visit: Payer: BLUE CROSS/BLUE SHIELD | Admitting: Neurology

## 2019-01-06 ENCOUNTER — Other Ambulatory Visit: Payer: Self-pay

## 2019-01-06 ENCOUNTER — Encounter: Payer: Self-pay | Admitting: *Deleted

## 2019-01-06 ENCOUNTER — Emergency Department
Admission: EM | Admit: 2019-01-06 | Discharge: 2019-01-06 | Disposition: A | Discharge status: Home / Self Care | Payer: BLUE CROSS/BLUE SHIELD | Attending: Emergency Medicine | Admitting: Emergency Medicine

## 2019-01-06 DIAGNOSIS — R569 Unspecified convulsions: Secondary | ICD-10-CM

## 2019-01-06 DIAGNOSIS — F445 Conversion disorder with seizures or convulsions: Secondary | ICD-10-CM

## 2019-01-06 HISTORY — DX: Conversion disorder with seizures or convulsions: F44.5

## 2019-01-06 LAB — GLUCOSE, CAPILLARY: Glucose-Capillary: 72 mg/dL (ref 70–99)

## 2019-01-06 LAB — POCT PREGNANCY, URINE: Preg Test, Ur: NEGATIVE
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
## 2019-01-06 NOTE — Discharge Instructions (Signed)
Work-up including EKG, urine, glucose were normal.  This is most likely secondary to your stress.  You should follow-up with your psychiatrist tomorrow to discuss your medications.  You should return to the ER for any other concerns.

## 2019-03-28 IMAGING — CT CT CERVICAL SPINE W/O CM
3 of 12 series · 6 of 33 positions shown, 7 images · non-contrast
Comparison: MRI head 11/12/2017

CLINICAL DATA: Multiple seizures over the past week with 3
witnessed seizures in a 5 minutes. Today.

EXAM:
CT HEAD WITHOUT CONTRAST
CT CERVICAL SPINE WITHOUT CONTRAST
TECHNIQUE: Multidetector CT imaging of the head and cervical spine was
performed following the standard protocol without intravenous
contrast. Multiplanar CT image reconstructions of the cervical spine
were also generated.

[Series 11: orthogonal bone · axial · 0.19mm/px · z∈[+995,+1202]mm · 3 of 109 slices shown, 4 images]
[im 1/109  soft-tissue]
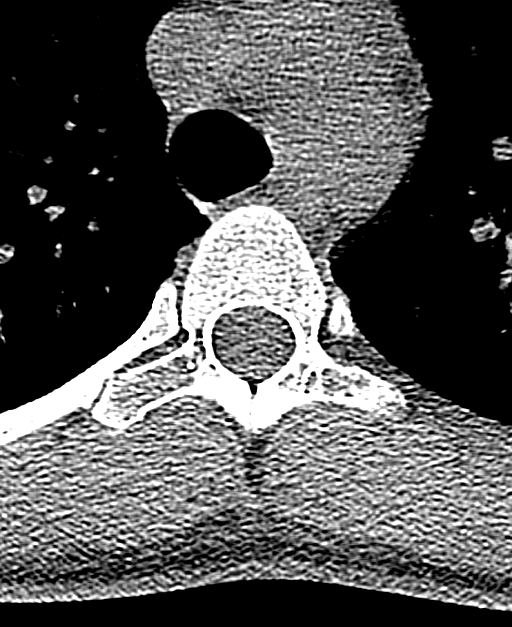
[im 1/109  bone]
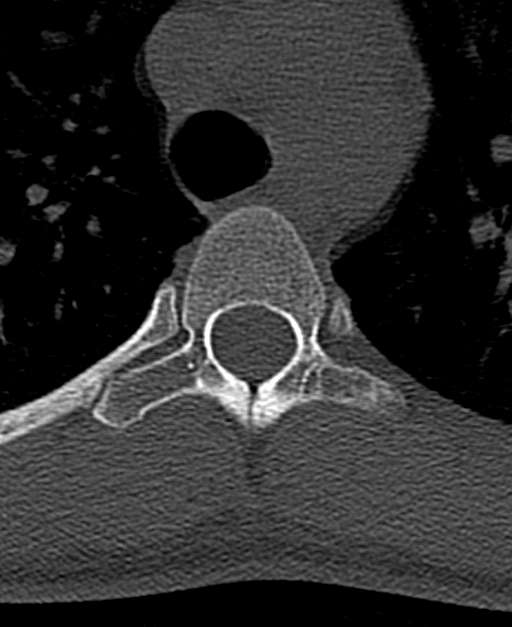
[im 55/109  bone]
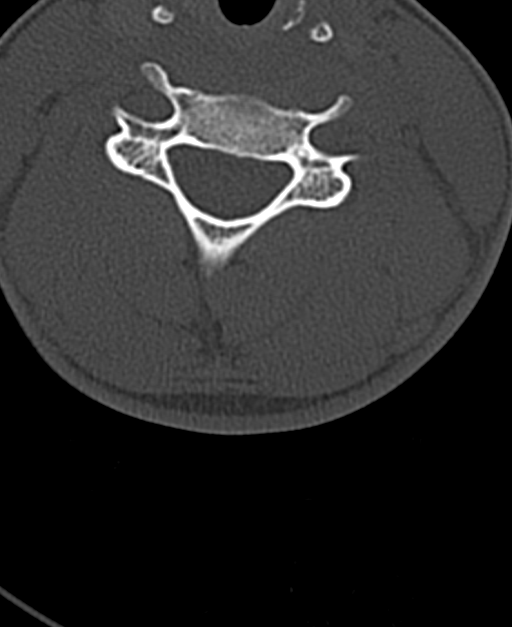
[im 109/109  bone]
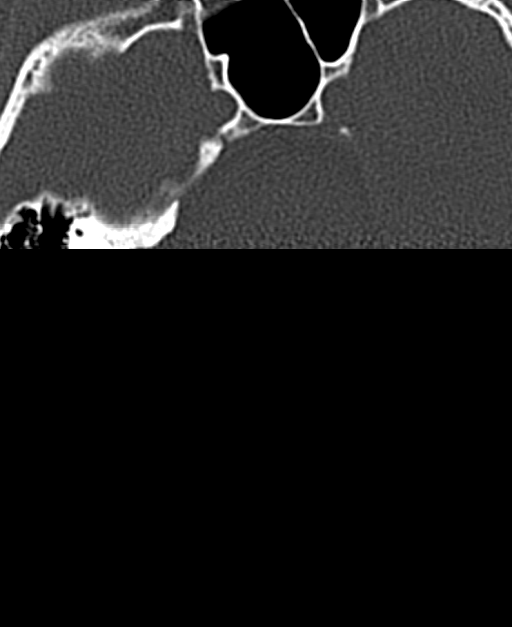

[Series 13: sagittal bone · sagittal · 0.23mm/px · 2 of 50 slices shown]
[im 17/50  bone]
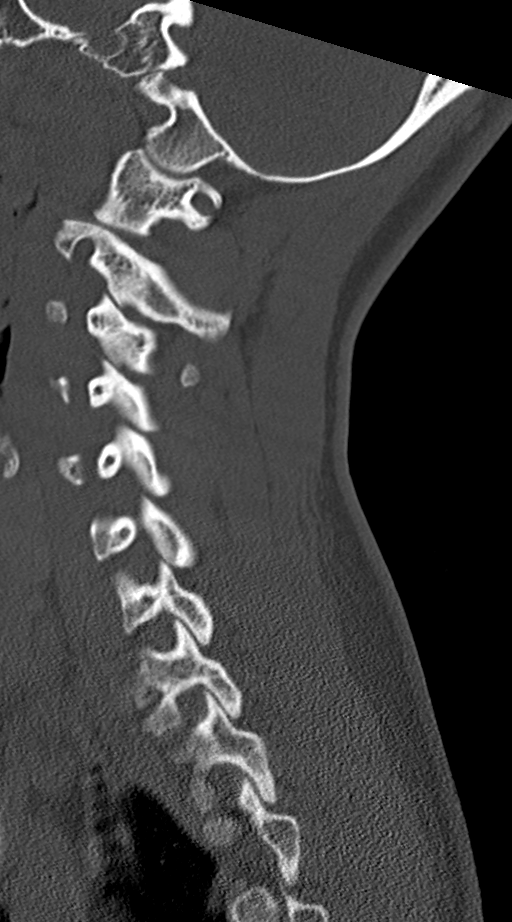
[im 33/50  bone]
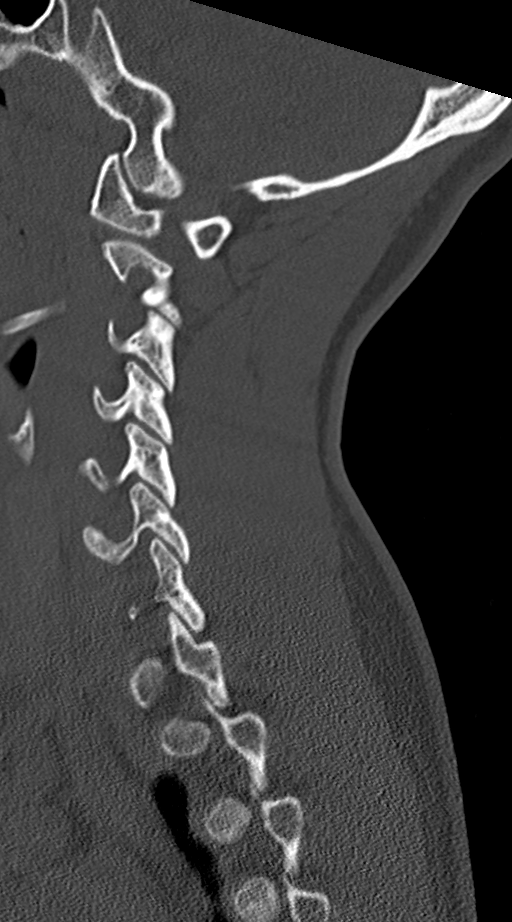

[Series 18: coronal bone · coronal · 0.19mm/px · 1 of 61 slices shown]
[im 31/61  bone]
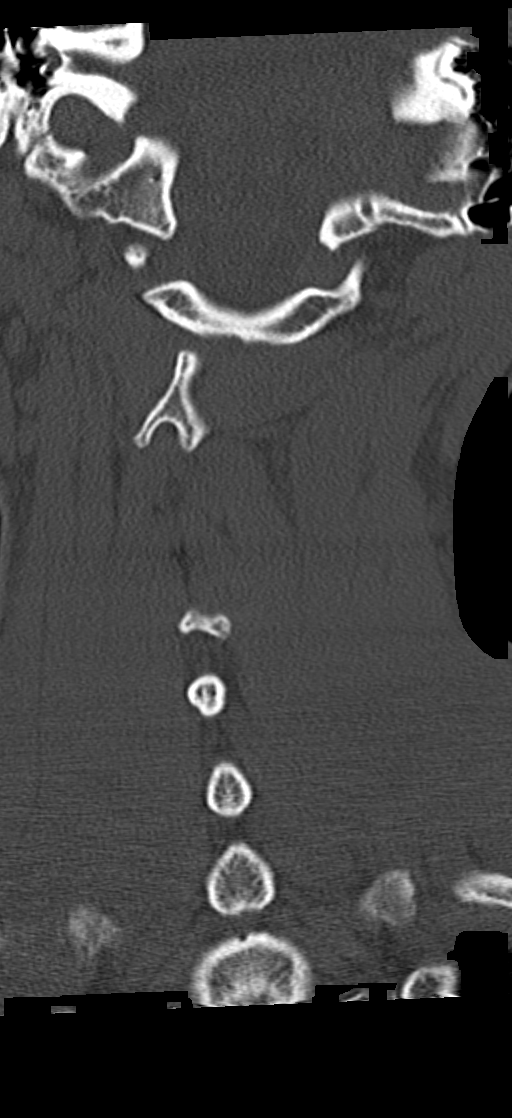

[6 of 33 positions shown; findings below may reference images not displayed]

FINDINGS: CT HEAD FINDINGS

BRAIN: The ventricles and sulci are normal. No intraparenchymal
hemorrhage, mass effect nor midline shift. No acute large vascular
territory infarcts. No abnormal extra-axial fluid collections. Basal
cisterns are midline and not effaced. No acute brainstem nor
cerebellar abnormality.

VASCULAR: Unremarkable.

SKULL/SOFT TISSUES: No skull fracture. No significant soft tissue
swelling.

ORBITS/SINUSES: The included ocular globes and orbital contents are
normal.The mastoid air-cells and included paranasal sinuses are
well-aerated.

OTHER: None.

CT CERVICAL SPINE FINDINGS

ALIGNMENT: Vertebral bodies in alignment. Maintained lordosis.

SKULL BASE AND VERTEBRAE: Cervical vertebral bodies and posterior
elements are intact. Intervertebral disc heights preserved. No
destructive bony lesions. C1-2 articulation maintained.

SOFT TISSUES AND SPINAL CANAL: Normal.

DISC LEVELS: No significant osseous canal stenosis or neural
foraminal narrowing.

UPPER CHEST: Lung apices are clear.

OTHER: None.
IMPRESSION: Normal CT appearance of the brain.

No acute cervical spine fracture or listhesis.

## 2019-04-24 ENCOUNTER — Ambulatory Visit (HOSPITAL_COMMUNITY)
Admission: EM | Admit: 2019-04-24 | Discharge: 2019-04-24 | Disposition: A | Discharge status: Home / Self Care | Payer: BC Managed Care – PPO | Attending: Family Medicine | Admitting: Family Medicine

## 2019-04-24 ENCOUNTER — Other Ambulatory Visit: Payer: Self-pay

## 2019-04-24 ENCOUNTER — Encounter (HOSPITAL_COMMUNITY): Payer: Self-pay

## 2019-04-24 DIAGNOSIS — N946 Dysmenorrhea, unspecified: Secondary | ICD-10-CM | POA: Insufficient documentation

## 2019-04-24 DIAGNOSIS — Z3202 Encounter for pregnancy test, result negative: Secondary | ICD-10-CM

## 2019-04-24 LAB — POCT URINALYSIS DIP (DEVICE)
Bilirubin Urine: NEGATIVE
Glucose, UA: NEGATIVE mg/dL
Ketones, ur: NEGATIVE mg/dL
Leukocytes,Ua: NEGATIVE
Nitrite: NEGATIVE
Protein, ur: NEGATIVE mg/dL
Specific Gravity, Urine: 1.025 (ref 1.005–1.030)
Urobilinogen, UA: 0.2 mg/dL (ref 0.0–1.0)
pH: 7 (ref 5.0–8.0)

## 2019-04-24 LAB — POCT PREGNANCY, URINE: Preg Test, Ur: NEGATIVE

## 2019-04-24 LAB — POC URINE PREG, ED: Preg Test, Ur: NEGATIVE

## 2019-04-24 MED ORDER — ONDANSETRON 4 MG PO TBDP: 4.0000 mg | ORAL_TABLET | Freq: Three times a day (TID) | ORAL | 0 refills | Status: DC | PRN

## 2019-04-24 MED ORDER — NAPROXEN 500 MG PO TABS: 500.0000 mg | ORAL_TABLET | Freq: Two times a day (BID) | ORAL | 0 refills | Status: DC

## 2019-04-24 MED ORDER — KETOROLAC TROMETHAMINE 30 MG/ML IJ SOLN
INTRAMUSCULAR | Status: AC
  Filled 2019-04-24: qty 1

## 2019-04-24 MED ORDER — KETOROLAC TROMETHAMINE 30 MG/ML IJ SOLN
30.0000 mg | Freq: Once | INTRAMUSCULAR | Status: AC
  Administered 2019-04-24: 30 mg via INTRAMUSCULAR

## 2019-04-24 NOTE — ED Triage Notes (Signed)
Pt report her period started 3 hrs ago and since then she is having abdominal pain, vomiting. Pt reports the abdominal pain is worse when she walks.

## 2019-04-24 NOTE — Discharge Instructions (Addendum)
We gave you an injection of toradol to help with pain Please continue with Naprosyn twice daily at home  May try over-the-counter Midol max as alternative Zofran/ondansetron as needed for nausea and vomiting-dissolves in mouth Swab pending to evaluate for any infectious causes contributing to worsening cycles  Follow-up with OB/GYN if symptoms persisting

## 2019-04-24 NOTE — ED Provider Notes (Signed)
[facility]    CSN: [id number] Arrival date & time: 04/24/19  1519      History   Chief Complaint Chief Complaint  Patient presents with  . Abdominal Pain  . Emesis    HPI Audrey Arnold is a 24 y.o. female history of pseudoseizures, presenting today for evaluation of menstrual cramping.  Patient states that her menstrual cycle began approximately 3 hours ago.  She has had a lot of abdominal pain along with vomiting since onset.  She reports that her past few cycles have been heavier and more painful than normal.  She typically does have painful cycles, but symptoms have been worse over the past couple of months.  She reports in January she did have 2 cycles.  She notes that she has had prior cysts rupture and was unsure if this could be contributing to her symptoms.  She has previously been on birth control including trying oral contraceptives, Depo and Nexplanon, but believes that this is triggered seizures and is no longer on birth control.  Patient does report being sexually active and has had a new partner of recently.  Denies any abnormal discharge, pelvic pain, itching or irritation prior to onset of cycles.  Denies any urinary symptoms of dysuria, increased frequency or urgency, hematuria.  Has tried some ibuprofen without relief of pain.  Patient is a Consulting civil engineer at [employer], from [employer].  Follows up with OB/GYN at home.  Has never had a Pap smear.  HPI  Past Medical History:  Diagnosis Date  . Migraine   . Pseudoseizure   . Seizures [facility])     Patient Active Problem List   Diagnosis Date Noted  . 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 LYMPH NODE DISSECTION      OB History   No obstetric history on file.      Home Medications    Prior to Admission medications   Medication Sig Start Date End Date Taking? Authorizing Provider  busPIRone (BUSPAR) 5 MG tablet Take 5 mg by mouth 3 (three)  times daily.   Yes [provider]  lurasidone (LATUDA) 20 MG TABS tablet Take by mouth.   Yes [provider]  propranolol (INDERAL) 10 MG tablet Take 10 mg by mouth 3 (three) times daily.   Yes [provider]  levothyroxine (SYNTHROID, LEVOTHROID) 50 MCG tablet Take 50 mcg by mouth daily. 08/30/17   [provider]  naproxen (NAPROSYN) 500 MG tablet Take 1 tablet (500 mg total) by mouth 2 (two) times daily. 04/24/19   Jibran Crookshanks C, PA-C  ondansetron (ZOFRAN ODT) 4 MG disintegrating tablet Take 1 tablet (4 mg total) by mouth every 8 (eight) hours as needed for nausea or vomiting. 04/24/19   Adelma Bowdoin, Junius Creamer, PA-C    Family History Family History  Problem Relation Age of Onset  . Lupus Mother   . Diverticulitis Mother   . Hypertension Father     Social History Social History   Tobacco Use  . Smoking status: Never Smoker  . Smokeless tobacco: Never Used  Substance Use Topics  . Alcohol use: Never  . Drug use: Not on file     Allergies   Contrast media [iodinated diagnostic agents]   Review of Systems Review of Systems  Constitutional: Negative for fever.  Respiratory: Negative for shortness of breath.   Cardiovascular: Negative for chest pain.  Gastrointestinal: Positive for abdominal pain, nausea and vomiting.  Negative for diarrhea.  Genitourinary: Positive for menstrual problem. Negative for dysuria, flank pain, genital sores, hematuria, vaginal bleeding, vaginal discharge and vaginal pain.  Musculoskeletal: Negative for back pain.  Skin: Negative for rash.  Neurological: Negative for dizziness, light-headedness and headaches.     Physical Exam Triage Vital Signs ED Triage Vitals  Enc Vitals Group     BP 04/24/19 1539 120/83     Pulse Rate 04/24/19 1539 79     Resp 04/24/19 1539 16     Temp 04/24/19 1539 98.8 F (37.1 C)     Temp Source 04/24/19 1539 Oral     SpO2 04/24/19 1539 100 %     Weight --      Height --       Head Circumference --      Peak Flow --      Pain Score 04/24/19 1536 8     Pain Loc --      Pain Edu? --      Excl. in GC? --    No data found.  Updated Vital Signs BP 120/83 (BP Location: Right Arm)   Pulse 79   Temp 98.8 F (37.1 C) (Oral)   Resp 16   LMP 04/24/2019 (Exact Date)   SpO2 100%   Visual Acuity Right Eye Distance:   Left Eye Distance:   Bilateral Distance:    Right Eye Near:   Left Eye Near:    Bilateral Near:     Physical Exam Vitals and nursing note reviewed.  Constitutional:      Appearance: She is well-developed.     Comments: No acute distress  HENT:     Head: Normocephalic and atraumatic.     Nose: Nose normal.  Eyes:     Conjunctiva/sclera: Conjunctivae normal.  Cardiovascular:     Rate and Rhythm: Normal rate.  Pulmonary:     Effort: Pulmonary effort is normal. No respiratory distress.     Comments: Breathing comfortably at rest, CTABL, no wheezing, rales or other adventitious sounds auscultated Abdominal:     General: There is no distension.     Tenderness: There is abdominal tenderness.     Comments: Abdomen soft, nondistended, tender to palpation to bilateral lower quadrants, more focal in mid abdomen and suprapubic area, negative rebound, negative Rovsing, negative McBurney's  Genitourinary:    Comments: Normal external female genitalia, no rashes or lesions noted, vaginal mucosa pink, moderate amount of bright red blood present in vagina, cervix pink, no erythema; no cervical motion tenderness on bimanual, mild left adnexal tenderness, no masses palpated bilaterally in adnexa Musculoskeletal:        General: Normal range of motion.     Cervical back: Neck supple.  Skin:    General: Skin is warm and dry.  Neurological:     Mental Status: She is alert and oriented to person, place, and time.      UC Treatments / Results  Labs (all labs ordered are listed, but only abnormal results are displayed) Labs Reviewed  POCT URINALYSIS  DIP (DEVICE) - Abnormal; Notable for the following components:      Result Value   Hgb urine dipstick MODERATE (*)    All other components within normal limits  POCT PREGNANCY, URINE  POC URINE PREG, ED  CERVICOVAGINAL ANCILLARY ONLY    EKG   Radiology No results found.  Procedures Procedures (including critical care time)  Medications Ordered in UC Medications  ketorolac (TORADOL) 30 MG/ML injection 30 mg (30  mg Intramuscular Given 04/24/19 1659)    Initial Impression / Assessment and Plan / UC Course  I have reviewed the triage vital signs and the nursing notes.  Pertinent labs & imaging results that were available during my care of the patient were reviewed by me and considered in my medical decision making (see chart for details).     Patient with dysmenorrhea.  Cycles are irregular, heavier and more painful than normal.  Checking vaginal swab to evaluate for STDs and any infectious causes contributing to worsening cycles.  Will defer treatment for this until results return.  Will treat symptomatically today with Zofran as needed for nausea and vomiting, Naprosyn for menstrual pain/cramping.  Do not suspect abdominal emergency at this time, negative peritoneal signs.  If continuing to have irregular/heavy/painful cycles to follow-up with OB/GYN at home as well as to have Pap smear updated.  Continue to monitor,Discussed strict return precautions. Patient verbalized understanding and is agreeable with plan.  Final Clinical Impressions(s) / UC Diagnoses   Final diagnoses:  Dysmenorrhea     Discharge Instructions     We gave you an injection of toradol to help with pain Please continue with Naprosyn twice daily at home  May try over-the-counter Midol max as alternative Zofran/ondansetron as needed for nausea and vomiting-dissolves in mouth Swab pending to evaluate for any infectious causes contributing to worsening cycles  Follow-up with OB/GYN if symptoms  persisting   ED Prescriptions    Medication Sig Dispense Auth. Provider   ondansetron (ZOFRAN ODT) 4 MG disintegrating tablet Take 1 tablet (4 mg total) by mouth every 8 (eight) hours as needed for nausea or vomiting. 20 tablet Kriti Katayama C, PA-C   naproxen (NAPROSYN) 500 MG tablet Take 1 tablet (500 mg total) by mouth 2 (two) times daily. 30 tablet Chaunce Winkels, [place] C, PA-C     PDMP not reviewed this encounter.   Janith Lima, [place] 04/24/19 2120

## 2019-04-29 ENCOUNTER — Telehealth (HOSPITAL_COMMUNITY): Payer: Self-pay | Admitting: Emergency Medicine

## 2019-04-29 LAB — CERVICOVAGINAL ANCILLARY ONLY
Bacterial vaginitis: POSITIVE — AB
Candida vaginitis: POSITIVE — AB
Chlamydia: POSITIVE — AB
Neisseria Gonorrhea: NEGATIVE
Trichomonas: NEGATIVE

## 2019-04-29 MED ORDER — FLUCONAZOLE 150 MG PO TABS: 150.0000 mg | ORAL_TABLET | Freq: Once | ORAL | 0 refills | Status: AC

## 2019-04-29 MED ORDER — METRONIDAZOLE 500 MG PO TABS: 500.0000 mg | ORAL_TABLET | Freq: Two times a day (BID) | ORAL | 0 refills | Status: AC

## 2019-04-29 MED ORDER — AZITHROMYCIN 250 MG PO TABS: 1000.0000 mg | ORAL_TABLET | Freq: Once | ORAL | 0 refills | Status: AC

## 2019-04-29 NOTE — Telephone Encounter (Signed)
Chlamydia is positive.  Rx po zithromax 1g #1 dose no refills was sent to the pharmacy of record.  Please refrain from sexual intercourse for 7 days to give the medicine time to work, sexual partners need to be notified and tested/treated.  Condoms may reduce risk of reinfection.  Recheck or followup with PCP for further evaluation if symptoms are not improving.   [facility] notified.  Bacterial vaginosis is positive. Pt needs treatment. Flagyl 500 mg BID x 7 days #14 no refills sent to patients pharmacy of choice.    Test for candida (yeast) was positive.  Prescription for fluconazole 150mg  po now, repeat dose in 3d if needed, #2 no refills, sent to the pharmacy of record.  Recheck or followup with PCP for further evaluation if symptoms are not improving.    Patient contacted by phone and made aware of    results. Pt verbalized understanding and had all questions answered.

## 2019-10-17 ENCOUNTER — Emergency Department (HOSPITAL_COMMUNITY)
Admission: EM | Admit: 2019-10-17 | Discharge: 2019-10-18 | Disposition: A | Discharge status: Home / Self Care | Payer: BC Managed Care – PPO | Attending: Emergency Medicine | Admitting: Emergency Medicine

## 2019-10-17 ENCOUNTER — Encounter (HOSPITAL_COMMUNITY): Payer: Self-pay | Admitting: Emergency Medicine

## 2019-10-17 ENCOUNTER — Other Ambulatory Visit: Payer: Self-pay

## 2019-10-17 DIAGNOSIS — F445 Conversion disorder with seizures or convulsions: Secondary | ICD-10-CM

## 2019-10-17 DIAGNOSIS — R569 Unspecified convulsions: Secondary | ICD-10-CM | POA: Insufficient documentation

## 2019-10-17 DIAGNOSIS — Z8669 Personal history of other diseases of the nervous system and sense organs: Secondary | ICD-10-CM | POA: Diagnosis not present

## 2019-10-17 LAB — CBC WITH DIFFERENTIAL/PLATELET
Abs Immature Granulocytes: 0.02 10*3/uL (ref 0.00–0.07)
Basophils Absolute: 0.1 10*3/uL (ref 0.0–0.1)
Basophils Relative: 1 %
Eosinophils Absolute: 0.1 10*3/uL (ref 0.0–0.5)
Eosinophils Relative: 1 %
HCT: 35.1 % — ABNORMAL LOW (ref 36.0–46.0)
Hemoglobin: 11.6 g/dL — ABNORMAL LOW (ref 12.0–15.0)
Immature Granulocytes: 0 %
Lymphocytes Relative: 29 %
Lymphs Abs: 2.6 10*3/uL (ref 0.7–4.0)
MCH: 30.2 pg (ref 26.0–34.0)
MCHC: 33 g/dL (ref 30.0–36.0)
MCV: 91.4 fL (ref 80.0–100.0)
Monocytes Absolute: 0.7 10*3/uL (ref 0.1–1.0)
Monocytes Relative: 8 %
Neutro Abs: 5.5 10*3/uL (ref 1.7–7.7)
Neutrophils Relative %: 61 %
Platelets: 197 10*3/uL (ref 150–400)
RBC: 3.84 MIL/uL — ABNORMAL LOW (ref 3.87–5.11)
RDW: 12.8 % (ref 11.5–15.5)
WBC: 9 10*3/uL (ref 4.0–10.5)
nRBC: 0 % (ref 0.0–0.2)

## 2019-10-17 LAB — I-STAT CHEM 8, ED
BUN: 17 mg/dL (ref 6–20)
Calcium, Ion: 1.18 mmol/L (ref 1.15–1.40)
Chloride: 105 mmol/L (ref 98–111)
Creatinine, Ser: 0.8 mg/dL (ref 0.44–1.00)
Glucose, Bld: 75 mg/dL (ref 70–99)
HCT: 36 % (ref 36.0–46.0)
Hemoglobin: 12.2 g/dL (ref 12.0–15.0)
Potassium: 3.6 mmol/L (ref 3.5–5.1)
Sodium: 141 mmol/L (ref 135–145)
TCO2: 22 mmol/L (ref 22–32)

## 2019-10-17 LAB — I-STAT BETA HCG BLOOD, ED (MC, WL, AP ONLY): I-stat hCG, quantitative: <5 m[IU]/mL (ref <5)

## 2019-10-17 NOTE — ED Triage Notes (Signed)
Patient with multiple seizures in a 6 min time frame, then again an hour later with 7 min time from with multiple seizures.  Patient has history of pseudoseizures.  Patient is not incontinent of urine.  Patient did have a postictal state that lasted until just before coming to ED.  She is having bilateral leg pain/cramping and headache.  CAOx4 at this time.

## 2019-10-17 NOTE — ED Provider Notes (Signed)
[facility] Provider Note   CSN: [id number] Arrival date & time: 10/17/19  2251     History Chief Complaint  Patient presents with  . Seizures    [facility] is a 24 y.o. female.  The history is provided by the patient.  Seizures Seizure activity on arrival: no   Seizure type:  Grand mal Preceding symptoms: no sensation of an aura present, no dizziness, no euphoria, no headache, no hyperventilation, no nausea, no numbness, no panic and no vision change   Initial focality:  None Episode characteristics: abnormal movements   Postictal symptoms: no confusion, no memory loss and no somnolence   Return to baseline: yes   Severity:  Mild Timing:  Clustered Progression:  Resolved Context: not alcohol withdrawal   Recent head injury:  No recent head injuries PTA treatment:  None History of seizures: no   Patient with pseudoseizures presents with pseudoseizure episode at work.  No known stressors.  Is not on medication for these episodes but takes Bipolar medication and      Past Medical History:  Diagnosis Date  . Migraine   . Pseudoseizure   . Seizures [facility])     Patient Active Problem List   Diagnosis Date Noted  . 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 LYMPH NODE DISSECTION       OB History   No obstetric history on file.     Family History  Problem Relation Age of Onset  . Lupus Mother   . Diverticulitis Mother   . Hypertension Father     Social History   Tobacco Use  . Smoking status: Never Smoker  . Smokeless tobacco: Never Used  Substance Use Topics  . Alcohol use: Never  . Drug use: Not on file    Home Medications Prior to Admission medications   Medication Sig Start Date End Date Taking? Authorizing Provider  busPIRone (BUSPAR) 5 MG tablet Take 5 mg by mouth 3 (three) times daily.    [provider]  levothyroxine (SYNTHROID, LEVOTHROID)  50 MCG tablet Take 50 mcg by mouth daily. 08/30/17   [provider]  lurasidone (LATUDA) 20 MG TABS tablet Take by mouth.    [provider]  naproxen (NAPROSYN) 500 MG tablet Take 1 tablet (500 mg total) by mouth 2 (two) times daily. 04/24/19   Wieters, Hallie C, PA-C  ondansetron (ZOFRAN ODT) 4 MG disintegrating tablet Take 1 tablet (4 mg total) by mouth every 8 (eight) hours as needed for nausea or vomiting. 04/24/19   Wieters, Hallie C, PA-C  propranolol (INDERAL) 10 MG tablet Take 10 mg by mouth 3 (three) times daily.    [provider]    Allergies    Contrast media [iodinated diagnostic agents]  Review of Systems   Review of Systems  Constitutional: Negative for fever.  HENT: Negative for congestion.   Eyes: Negative for visual disturbance.  Respiratory: Negative for shortness of breath.   Cardiovascular: Negative for chest pain.  Gastrointestinal: Negative for abdominal pain.  Genitourinary: Negative for difficulty urinating.  Musculoskeletal: Negative for arthralgias.  Skin: Negative for rash.  Neurological: Positive for seizures.  Psychiatric/Behavioral: Negative for agitation.  All other systems reviewed and are negative.   Physical Exam Updated Vital Signs BP (!) 129/78 (BP Location: Right Arm)   Pulse 75   Temp 98.1 F (36.7 C) (Oral)   Resp 14   Ht 5\' 4"  (1.626  m)   Wt 62.6 kg   SpO2 100%   BMI 23.69 kg/m   Physical Exam Vitals and nursing note reviewed.  Constitutional:      General: She is not in acute distress.    Appearance: Normal appearance.  HENT:     Head: Normocephalic and atraumatic.     Nose: Nose normal.  Eyes:     Conjunctiva/sclera: Conjunctivae normal.     Pupils: Pupils are equal, round, and reactive to light.  Cardiovascular:     Rate and Rhythm: Normal rate and regular rhythm.     Pulses: Normal pulses.     Heart sounds: Normal heart sounds.  Pulmonary:     Effort: Pulmonary effort is normal.     Breath  sounds: Normal breath sounds.  Abdominal:     General: Abdomen is flat. Bowel sounds are normal.     Tenderness: There is no abdominal tenderness. There is no guarding or rebound.  Musculoskeletal:        General: Normal range of motion.     Cervical back: Normal range of motion and neck supple.  Skin:    General: Skin is warm and dry.     Capillary Refill: Capillary refill takes less than 2 seconds.  Neurological:     General: No focal deficit present.     Mental Status: She is alert.     Cranial Nerves: No cranial nerve deficit.     Deep Tendon Reflexes: Reflexes normal.  Psychiatric:        Mood and Affect: Mood normal.        Behavior: Behavior normal.     ED Results / Procedures / Treatments   Labs (all labs ordered are listed, but only abnormal results are displayed) Labs Reviewed  CBC WITH DIFFERENTIAL/PLATELET  I-STAT CHEM 8, ED  I-STAT BETA HCG BLOOD, ED ([facility], [facility], [facility] ONLY)    EKG None  Radiology No results found.  Procedures Procedures (including critical care time)  Medications Ordered in ED Medications - No data to display  ED Course  I have reviewed the triage vital signs and the nursing notes.  Pertinent labs & imaging results that were available during my care of the patient were reviewed by me and considered in my medical decision making (see chart for details).    Resting comfortably in the room, no further issues. Has known pseudoseizures.  Follow up with PMD regarding ongoing care.    [facility] was evaluated in Emergency Department on 10/18/2019 for the symptoms described in the history of present illness. She was evaluated in the context of the global COVID-19 pandemic, which necessitated consideration that the patient might be at risk for infection with the SARS-CoV-2 virus that causes COVID-19. Institutional protocols and algorithms that pertain to the evaluation of patients at risk for COVID-19 are in a state of rapid change based on information  released by regulatory bodies including the CDC and federal and state organizations. These policies and algorithms were followed during the patient's care in the ED.  Final Clinical Impression(s) / ED Diagnoses   Return for intractable cough, coughing up blood,fevers >100.4 unrelieved by medication, shortness of breath, intractable vomiting, chest pain, shortness of breath, weakness,numbness, changes in speech, facial asymmetry,abdominal pain, passing out,Inability to tolerate liquids or food, cough, altered mental status or any concerns. No signs of systemic illness or infection. The patient is nontoxic-appearing on exam and vital signs are within normal limits.   I have reviewed the triage vital  signs and the nursing notes. Pertinent labs &imaging results that were available during my care of the patient were reviewed by me and considered in my medical decision making (see chart for details).After history, exam, and medical workup I feel the patient has beenappropriately medically screened and is safe for discharge home. Pertinent diagnoses were discussed with the patient. Patient was given return precautions.     Verenis Nicosia, MD 10/18/19 2956

## 2019-10-18 NOTE — ED Notes (Signed)
Discharge instructions discussed with pt. Pt verbalized understanding. Pt stable and ambulatory. No signature pad available. 

## 2019-10-18 NOTE — ED Notes (Signed)
Pt ambulated to bathroom independently. Pt tolerating PO fluids well

## 2021-01-23 ENCOUNTER — Emergency Department (HOSPITAL_COMMUNITY)
Admission: EM | Admit: 2021-01-23 | Discharge: 2021-01-24 | Disposition: A | Discharge status: Home / Self Care | Payer: No Typology Code available for payment source | Attending: Emergency Medicine | Admitting: Emergency Medicine

## 2021-01-23 ENCOUNTER — Encounter (HOSPITAL_COMMUNITY): Payer: Self-pay | Admitting: Emergency Medicine

## 2021-01-23 ENCOUNTER — Other Ambulatory Visit: Payer: Self-pay

## 2021-01-23 DIAGNOSIS — R002 Palpitations: Secondary | ICD-10-CM | POA: Insufficient documentation

## 2021-01-23 DIAGNOSIS — R202 Paresthesia of skin: Secondary | ICD-10-CM | POA: Insufficient documentation

## 2021-01-23 DIAGNOSIS — F419 Anxiety disorder, unspecified: Secondary | ICD-10-CM | POA: Insufficient documentation

## 2021-01-23 DIAGNOSIS — R110 Nausea: Secondary | ICD-10-CM | POA: Insufficient documentation

## 2021-01-23 DIAGNOSIS — R42 Dizziness and giddiness: Secondary | ICD-10-CM | POA: Insufficient documentation

## 2021-01-23 NOTE — ED Triage Notes (Signed)
Patient here after having a panic attack and had a syncopal episode.  Patient denies any chest pain, no shortness of breath.  She does have tingling in her fingers and mouth which is subsiding at this time.

## 2021-01-24 DIAGNOSIS — R42 Dizziness and giddiness: Secondary | ICD-10-CM | POA: Diagnosis not present

## 2021-01-24 LAB — I-STAT CHEM 8, ED
BUN: 20 mg/dL (ref 6–20)
Calcium, Ion: 1.2 mmol/L (ref 1.15–1.40)
Chloride: 106 mmol/L (ref 98–111)
Creatinine, Ser: 0.8 mg/dL (ref 0.44–1.00)
Glucose, Bld: 95 mg/dL (ref 70–99)
HCT: 36 % (ref 36.0–46.0)
Hemoglobin: 12.2 g/dL (ref 12.0–15.0)
Potassium: 4.8 mmol/L (ref 3.5–5.1)
Sodium: 138 mmol/L (ref 135–145)
TCO2: 24 mmol/L (ref 22–32)

## 2021-01-24 LAB — I-STAT BETA HCG BLOOD, ED (MC, WL, AP ONLY): I-stat hCG, quantitative: <5 m[IU]/mL (ref <5)

## 2021-01-24 NOTE — ED Provider Notes (Signed)
[facility] EMERGENCY DEPARTMENT Provider Note   CSN: [id number] Arrival date & time: 01/23/21  2032     History Chief Complaint  Patient presents with   Panic Attack   Loss of Consciousness    [facility] is a 25 y.o. female with a history of migraines, pseudoseizures, seizures, depression, and PTSD who presents to the emergency department via EMS for evaluation of lightheadedness earlier today.  Patient states that she had not smoked marijuana since August, she did so this evening, and subsequently developed lightheadedness, nausea, palpitations as if her heart was racing, anxiety, and oral paresthesias.  She lay down on the ground, she is not sure if she passed out.  Her constellation of symptoms concerned her prompting to call 911.  No alleviating or aggravating factors to her symptoms.  Overall symptoms have resolved, she currently feels fatigued tired and a bit shaky.  She mentions that she did not have much to eat or drink throughout the day.  She denies chest pain, vomiting, hemoptysis, or leg pain/swelling.  Denies family history of sudden cardiac death.  HPI     Past Medical History:  Diagnosis Date   Migraine    Pseudoseizure    Seizures (HCC)     Patient Active Problem List   Diagnosis Date Noted   Nonepileptic episode (HCC) 11/15/2017    Past Surgical History:  Procedure Laterality Date   LYMPH NODE DISSECTION       OB History   No obstetric history on file.     Family History  Problem Relation Age of Onset   Lupus Mother    Diverticulitis Mother    Hypertension Father     Social History   Tobacco Use   Smoking status: Never   Smokeless tobacco: Never  Substance Use Topics   Alcohol use: Never    Home Medications Prior to Admission medications   Medication Sig Start Date End Date Taking? Authorizing Provider  busPIRone (BUSPAR) 5 MG tablet Take 5 mg by mouth 3 (three) times daily.    [provider]  levothyroxine  (SYNTHROID, LEVOTHROID) 50 MCG tablet Take 50 mcg by mouth daily. 08/30/17   [provider]  lurasidone (LATUDA) 20 MG TABS tablet Take by mouth.    [provider]  naproxen (NAPROSYN) 500 MG tablet Take 1 tablet (500 mg total) by mouth 2 (two) times daily. 04/24/19   Wieters, Hallie C, PA-C  ondansetron (ZOFRAN ODT) 4 MG disintegrating tablet Take 1 tablet (4 mg total) by mouth every 8 (eight) hours as needed for nausea or vomiting. 04/24/19   Wieters, Hallie C, PA-C  propranolol (INDERAL) 10 MG tablet Take 10 mg by mouth 3 (three) times daily.    [provider]    Allergies    Contrast media [iodinated diagnostic agents]  Review of Systems   Review of Systems  Constitutional:  Positive for fatigue. Negative for chills and fever.  Respiratory:  Negative for cough.   Cardiovascular:  Positive for palpitations. Negative for chest pain and leg swelling.  Gastrointestinal:  Positive for nausea. Negative for abdominal pain, diarrhea and vomiting.  Neurological:  Positive for syncope (+/-) and light-headedness.  Psychiatric/Behavioral:  The patient is nervous/anxious.   All other systems reviewed and are negative.  Physical Exam Updated Vital Signs BP 116/81 (BP Location: Right Arm)   Pulse 86   Temp 97.9 F (36.6 C) (Oral)   Resp 16   SpO2 100%   Physical Exam Vitals and  nursing note reviewed.  Constitutional:      General: She is not in acute distress.    Appearance: She is well-developed. She is not toxic-appearing.  HENT:     Head: Normocephalic and atraumatic.  Eyes:     General:        Right eye: No discharge.        Left eye: No discharge.     Extraocular Movements: Extraocular movements intact.     Conjunctiva/sclera: Conjunctivae normal.     Pupils: Pupils are equal, round, and reactive to light.  Cardiovascular:     Rate and Rhythm: Normal rate and regular rhythm.     Heart sounds: No murmur heard. Pulmonary:     Effort: Pulmonary effort is  normal. No respiratory distress.     Breath sounds: Normal breath sounds. No wheezing, rhonchi or rales.  Abdominal:     General: There is no distension.     Palpations: Abdomen is soft.     Tenderness: There is no abdominal tenderness.  Musculoskeletal:     Cervical back: Neck supple.  Skin:    General: Skin is warm and dry.     Findings: No rash.  Neurological:     Mental Status: She is alert.     Comments: Clear speech.  CN III through XII grossly intact.  Sensation grossly intact bilateral upper and lower extremities.  5-5 symmetric grip strength.  5-5 strength with plantar dorsiflexion bilaterally.  Normal finger-nose.  Negative pronator drift.  Ambulatory  Psychiatric:        Behavior: Behavior normal.    ED Results / Procedures / Treatments   Labs (all labs ordered are listed, but only abnormal results are displayed) Labs Reviewed  I-STAT CHEM 8, ED  I-STAT BETA HCG BLOOD, ED ([facility], [facility], [facility] ONLY)    EKG EKG Interpretation  Date/Time:  Monday January 23 2021 20:44:25 EST Ventricular Rate:  115 PR Interval:  174 QRS Duration: 82 QT Interval:  314 QTC Calculation: 434 R Axis:   86 Text Interpretation: Sinus tachycardia Otherwise normal ECG Confirmed by Drema Pry ([id number]) on 01/24/2021 5:22:44 AM  Radiology No results found.  Procedures Procedures   Medications Ordered in ED Medications - No data to display  ED Course  I have reviewed the triage vital signs and the nursing notes.  Pertinent labs & imaging results that were available during my care of the patient were reviewed by me and considered in my medical decision making (see chart for details).    MDM Rules/Calculators/A&P                           Patient presents to the ED for evaluation after an episode of lightheadedness with several associated symptoms after smoking marijuana.  Patient is nontoxic, resting comfortably, her initial tachycardia has resolved.  She is currently fairly asymptomatic,  or feels tired at this time and a bit shaky.  Additional history obtained:  Additional history obtained from chart review & nursing note review.   EKG: Sinus tachycardia  Lab Tests:  I Ordered, reviewed, and interpreted labs, which included:  Pregnancy test: Negative I-STAT Chem-8: Unremarkable  ED Course:  EKG on arrival with sinus tachycardia, patient's heart rate has normalized, no other concerning EKG findings.  Pregnancy test is negative therefore doubt ectopic pregnancy.  She has no significant electrolyte derangement, anemia, or renal dysfunction.  She is ambulatory without focal neuro deficits.   She is feeling  improved with reassuring work-up and physical exam therefore feel she is reasonable for discharge.Discussed avoiding marijuana. Discussed rest & hydration. I discussed results, treatment plan, need for follow-up, and return precautions with the patient. Provided opportunity for questions, patient confirmed understanding and is in agreement with plan.   Portions of this note were generated with Scientist, clinical (histocompatibility and immunogenetics). Dictation errors may occur despite best attempts at proofreading.  Final Clinical Impression(s) / ED Diagnoses Final diagnoses:  Lightheadedness    Rx / DC Orders ED Discharge Orders     None        Cherly Anderson, PA-C 01/24/21 0558    Nira Conn, MD 01/24/21 [phone]

## 2021-01-24 NOTE — Discharge Instructions (Signed)
You were seen in the Er today after lightheadedness with several other symptoms.  Your pregnancy test was negative.  Your electrolytes are normal.  Your EKG showed sinus tachycardia which is a mildly fast heart rate which improved when we re checked your vitals.   Please avoid marijuana. Please rest and stay well hydrated.  Follow up with primary care within 3 days. Return to the Er for new or worsening symptoms including but not limited to passing out, chest pain, trouble breathing, trouble walking, or any other concerns.

## 2021-02-04 ENCOUNTER — Encounter (HOSPITAL_COMMUNITY): Payer: Self-pay | Admitting: Emergency Medicine

## 2021-02-04 ENCOUNTER — Other Ambulatory Visit: Payer: Self-pay

## 2021-02-04 ENCOUNTER — Ambulatory Visit (HOSPITAL_COMMUNITY)
Admission: EM | Admit: 2021-02-04 | Discharge: 2021-02-04 | Disposition: A | Discharge status: Home / Self Care | Payer: No Typology Code available for payment source | Attending: Physician Assistant | Admitting: Physician Assistant

## 2021-02-04 DIAGNOSIS — J0140 Acute pansinusitis, unspecified: Secondary | ICD-10-CM

## 2021-02-04 MED ORDER — METHYLPREDNISOLONE ACETATE 80 MG/ML IJ SUSP
INTRAMUSCULAR | Status: AC
  Filled 2021-02-04: qty 1

## 2021-02-04 MED ORDER — AMOXICILLIN-POT CLAVULANATE 875-125 MG PO TABS: 1.0000 | ORAL_TABLET | Freq: Two times a day (BID) | ORAL | 0 refills | Status: DC

## 2021-02-04 MED ORDER — METHYLPREDNISOLONE ACETATE 80 MG/ML IJ SUSP
60.0000 mg | Freq: Once | INTRAMUSCULAR | Status: AC
  Administered 2021-02-04: 60 mg via INTRAMUSCULAR

## 2021-02-04 NOTE — ED Triage Notes (Signed)
Pt is present today with a migraine, nasal congestion, and body aches. Pt sx started one week ago

## 2021-02-04 NOTE — ED Provider Notes (Signed)
[facility]    CSN: [id number] Arrival date & time: 02/04/21  1138      History   Chief Complaint Chief Complaint  Patient presents with   Migraine   Nasal Congestion   Generalized Body Aches    HPI [facility] is a 25 y.o. female.   Patient presents today with a week plus history of URI symptoms.  Reports nasal congestion, sinus pressure, body aches, nausea, fatigue, malaise.  Reports that she has had ongoing sinus headaches that have led to a migraine.  She denies fever, vomiting, abdominal pain, chest pain, shortness of breath.  Does report sick contacts as there have been many viruses circulating at her place of employment.  Denies any specific known sick contacts.  She denies any recent antibiotic use.  She is confident that she is not pregnant.  She denies history of allergies or asthma.  She is having difficulty with daily activities as result of symptoms.  She has tried over-the-counter medication including TheraFlu and analgesics without improvement of symptoms.  She does have a history of migraines and states current symptoms are similar to previous episodes of this condition.  She is not currently treated for migraines and has not seen a neurologist in several years.   Past Medical History:  Diagnosis Date   Migraine    Pseudoseizure    Seizures [facility])     Patient Active Problem List   Diagnosis Date Noted   Nonepileptic episode (HCC) 11/15/2017    Past Surgical History:  Procedure Laterality Date   LYMPH NODE DISSECTION      OB History   No obstetric history on file.      Home Medications    Prior to Admission medications   Medication Sig Start Date End Date Taking? Authorizing Provider  amoxicillin-clavulanate (AUGMENTIN) 875-125 MG tablet Take 1 tablet by mouth every 12 (twelve) hours. 02/04/21  Yes Javionna Leder K, PA-C  busPIRone (BUSPAR) 5 MG tablet Take 5 mg by mouth 3 (three) times daily.    [provider]  levothyroxine  (SYNTHROID, LEVOTHROID) 50 MCG tablet Take 50 mcg by mouth daily. 08/30/17   [provider]  lurasidone (LATUDA) 20 MG TABS tablet Take by mouth.    [provider]  naproxen (NAPROSYN) 500 MG tablet Take 1 tablet (500 mg total) by mouth 2 (two) times daily. 04/24/19   Wieters, Hallie C, PA-C  ondansetron (ZOFRAN ODT) 4 MG disintegrating tablet Take 1 tablet (4 mg total) by mouth every 8 (eight) hours as needed for nausea or vomiting. 04/24/19   Wieters, Hallie C, PA-C  propranolol (INDERAL) 10 MG tablet Take 10 mg by mouth 3 (three) times daily.    [provider]    Family History Family History  Problem Relation Age of Onset   Lupus Mother    Diverticulitis Mother    Hypertension Father     Social History Social History   Tobacco Use   Smoking status: Never   Smokeless tobacco: Never  Substance Use Topics   Alcohol use: Never     Allergies   Contrast media [iodinated diagnostic agents]   Review of Systems Review of Systems  Constitutional:  Positive for activity change and fatigue. Negative for appetite change and fever.  HENT:  Positive for congestion, postnasal drip, sinus pressure and sore throat. Negative for sneezing.   Respiratory:  Negative for cough and shortness of breath.   Cardiovascular:  Negative for chest pain.  Gastrointestinal:  Positive for  nausea. Negative for abdominal pain, diarrhea and vomiting.  Musculoskeletal:  Positive for arthralgias and myalgias.  Neurological:  Positive for headaches. Negative for dizziness and light-headedness.    Physical Exam Triage Vital Signs ED Triage Vitals  Enc Vitals Group     BP 02/04/21 1315 110/72     Pulse Rate 02/04/21 1315 74     Resp 02/04/21 1315 18     Temp 02/04/21 1315 97.8 F (36.6 C)     Temp src --      SpO2 02/04/21 1315 100 %     Weight --      Height --      Head Circumference --      Peak Flow --      Pain Score 02/04/21 1314 8     Pain Loc --      Pain Edu? --       Excl. in GC? --    No data found.  Updated Vital Signs BP 110/72   Pulse 74   Temp 97.8 F (36.6 C)   Resp 18   SpO2 100%   Visual Acuity Right Eye Distance:   Left Eye Distance:   Bilateral Distance:    Right Eye Near:   Left Eye Near:    Bilateral Near:     Physical Exam Vitals reviewed.  Constitutional:      General: She is awake. She is not in acute distress.    Appearance: Normal appearance. She is well-developed. She is not ill-appearing.     Comments: Very pleasant female appears stated age no acute distress sitting comfortably in exam room  HENT:     Head: Normocephalic and atraumatic.     Right Ear: Tympanic membrane, ear canal and external ear normal. Tympanic membrane is not erythematous or bulging.     Left Ear: Tympanic membrane, ear canal and external ear normal. Tympanic membrane is not erythematous or bulging.     Nose:     Right Sinus: Maxillary sinus tenderness and frontal sinus tenderness present.     Left Sinus: Maxillary sinus tenderness and frontal sinus tenderness present.     Mouth/Throat:     Pharynx: Uvula midline. Posterior oropharyngeal erythema present. No oropharyngeal exudate.     Comments: Significant erythema and drainage in posterior oropharynx Cardiovascular:     Rate and Rhythm: Normal rate and regular rhythm.     Heart sounds: Normal heart sounds, S1 normal and S2 normal. No murmur heard. Pulmonary:     Effort: Pulmonary effort is normal.     Breath sounds: Normal breath sounds. No wheezing, rhonchi or rales.     Comments: Clear to auscultation bilaterally Psychiatric:        Behavior: Behavior is cooperative.     UC Treatments / Results  Labs (all labs ordered are listed, but only abnormal results are displayed) Labs Reviewed - No data to display  EKG   Radiology No results found.  Procedures Procedures (including critical care time)  Medications Ordered in UC Medications  methylPREDNISolone acetate  (DEPO-MEDROL) injection 60 mg (has no administration in time range)    Initial Impression / Assessment and Plan / UC Course  I have reviewed the triage vital signs and the nursing notes.  Pertinent labs & imaging results that were available during my care of the patient were reviewed by me and considered in my medical decision making (see chart for details).     No indication for viral testing given patient has been  symptomatic for over a week and this would not change management.  Concern for sinus infection given prolonged and worsening symptoms.  She was given Depo-Medrol in clinic and started on antibiotic (Augmentin).  Recommended she use Mucinex, Flonase, Tylenol for additional symptom relief.  Encouraged her to rest and drink plenty of fluid.  Discussed alarm symptoms that warrant emergent evaluation.  Strict return precautions given to which she expressed understanding.  Final Clinical Impressions(s) / UC Diagnoses   Final diagnoses:  Acute non-recurrent pansinusitis     Discharge Instructions      We gave you a shot of steroids today to help with your symptoms.  We also are going to start you on an antibiotic (Augmentin).  Take this with food as it can upset your stomach.  I recommend you use Mucinex, Flonase, Tylenol for symptom relief.  Make sure you are resting and drinking plenty of fluid.  If you have any persistent or worsening symptoms please return for reevaluation as we discussed.     ED Prescriptions     Medication Sig Dispense Auth. Provider   amoxicillin-clavulanate (AUGMENTIN) 875-125 MG tablet Take 1 tablet by mouth every 12 (twelve) hours. 14 tablet Abu Heavin, Noberto Retort, PA-C      PDMP not reviewed this encounter.   Jeani Hawking, PA-C 02/04/21 1330

## 2021-02-04 NOTE — Discharge Instructions (Signed)
We gave you a shot of steroids today to help with your symptoms.  We also are going to start you on an antibiotic (Augmentin).  Take this with food as it can upset your stomach.  I recommend you use Mucinex, Flonase, Tylenol for symptom relief.  Make sure you are resting and drinking plenty of fluid.  If you have any persistent or worsening symptoms please return for reevaluation as we discussed.

## 2021-05-10 ENCOUNTER — Other Ambulatory Visit: Payer: Self-pay

## 2021-05-10 ENCOUNTER — Ambulatory Visit (INDEPENDENT_AMBULATORY_CARE_PROVIDER_SITE_OTHER): Payer: No Typology Code available for payment source | Admitting: Obstetrics and Gynecology

## 2021-05-10 VITALS — BP 113/81 | HR 86 | Wt 131.1 lb

## 2021-05-10 DIAGNOSIS — Z3201 Encounter for pregnancy test, result positive: Secondary | ICD-10-CM

## 2021-05-10 DIAGNOSIS — E039 Hypothyroidism, unspecified: Secondary | ICD-10-CM

## 2021-05-10 LAB — POCT PREGNANCY, URINE: Preg Test, Ur: POSITIVE — AB

## 2021-05-10 NOTE — Progress Notes (Signed)
Patient here today for pregnancy test. Urine pregnancy test positive. Patient denies any pain or vaginal bleeding. Per patient her LMP was 03/27/21 making her [redacted]w[redacted]d with an EDD of 12/22/21. Patient is new to the area and does not have an established OB/GYN. Patient requested to receive prenatal care here as this is the closest clinic to her. New OB appointment scheduled. Patient is interested in a waterbirth. I recommended patient to begin taking prenatal vitamins. Patient denies any other concerns or questions.   Paulina Fusi, RN 05/10/21

## 2021-05-10 NOTE — Progress Notes (Signed)
1/0 at 6+ wks approximately. Highly desired pregnancy. + UPT in setting of secondary amenorrhea.  - Reviewed medical and surgical history as well as medications.  - Discussed genetic testing both Horizon and NIPs.  - h/o hypothyroidism - not currently on her synthroid - will check TSH today separate from routine PNL.  - Migraines - reviewed some safe medications in pregnancy.  - Discussed MyChart use.  - Spoke with patient and welcomed her to the practice.  - Discussed practice of CNMs and physicians.

## 2021-05-10 NOTE — Patient Instructions (Signed)

## 2021-05-11 ENCOUNTER — Other Ambulatory Visit: Payer: Self-pay

## 2021-05-11 DIAGNOSIS — R7989 Other specified abnormal findings of blood chemistry: Secondary | ICD-10-CM

## 2021-05-11 LAB — TSH: TSH: 4.68 u[IU]/mL — ABNORMAL HIGH (ref 0.450–4.500)

## 2021-05-11 MED ORDER — LEVOTHYROXINE SODIUM 75 MCG PO TABS: 75.0000 ug | ORAL_TABLET | Freq: Every day | ORAL | 3 refills | Status: AC

## 2021-05-11 NOTE — Addendum Note (Signed)
Addended by: Faythe Casa on: 05/11/2021 11:58 AM   Modules accepted: Orders

## 2021-05-11 NOTE — Addendum Note (Signed)
Addended by: Milas Hock A on: 05/11/2021 08:12 AM   Modules accepted: Orders

## 2021-05-11 NOTE — Progress Notes (Signed)
Dr. Para March requested to have Free T4 added to labs drawn yesterday. Lab notified and added to yesterday's blood drawn.    Leonette Nutting  05/11/21

## 2021-05-12 LAB — T4, FREE: Free T4: 1.14 ng/dL (ref 0.82–1.77)

## 2021-05-12 LAB — SPECIMEN STATUS REPORT

## 2021-05-14 ENCOUNTER — Other Ambulatory Visit: Payer: Self-pay

## 2021-05-14 ENCOUNTER — Inpatient Hospital Stay (HOSPITAL_COMMUNITY): Payer: BLUE CROSS/BLUE SHIELD

## 2021-05-14 ENCOUNTER — Inpatient Hospital Stay (HOSPITAL_COMMUNITY)
Admission: AD | Admit: 2021-05-14 | Discharge: 2021-05-14 | Disposition: A | Discharge status: Home / Self Care | Payer: BLUE CROSS/BLUE SHIELD | Attending: Obstetrics & Gynecology | Admitting: Obstetrics & Gynecology

## 2021-05-14 ENCOUNTER — Encounter (HOSPITAL_COMMUNITY): Payer: Self-pay | Admitting: Obstetrics & Gynecology

## 2021-05-14 DIAGNOSIS — O4690 Antepartum hemorrhage, unspecified, unspecified trimester: Secondary | ICD-10-CM

## 2021-05-14 DIAGNOSIS — Z6790 Unspecified blood type, Rh positive: Secondary | ICD-10-CM | POA: Diagnosis not present

## 2021-05-14 DIAGNOSIS — Z6720 Type B blood, Rh positive: Secondary | ICD-10-CM | POA: Diagnosis not present

## 2021-05-14 DIAGNOSIS — Z3A01 Less than 8 weeks gestation of pregnancy: Secondary | ICD-10-CM | POA: Insufficient documentation

## 2021-05-14 DIAGNOSIS — O209 Hemorrhage in early pregnancy, unspecified: Secondary | ICD-10-CM | POA: Diagnosis not present

## 2021-05-14 DIAGNOSIS — O26891 Other specified pregnancy related conditions, first trimester: Secondary | ICD-10-CM | POA: Diagnosis present

## 2021-05-14 HISTORY — DX: Hypothyroidism, unspecified: E03.9

## 2021-05-14 LAB — CBC
HCT: 36.5 % (ref 36.0–46.0)
Hemoglobin: 12.4 g/dL (ref 12.0–15.0)
MCH: 30.5 pg (ref 26.0–34.0)
MCHC: 34 g/dL (ref 30.0–36.0)
MCV: 89.9 fL (ref 80.0–100.0)
Platelets: 209 10*3/uL (ref 150–400)
RBC: 4.06 MIL/uL (ref 3.87–5.11)
RDW: 12.5 % (ref 11.5–15.5)
WBC: 6.8 10*3/uL (ref 4.0–10.5)
nRBC: 0 % (ref 0.0–0.2)

## 2021-05-14 LAB — WET PREP, GENITAL
Sperm: NONREACTIVE
Trich, Wet Prep: NONREACTIVE — AB
WBC, Wet Prep HPF POC: >=10 — AB (ref <10)
Yeast Wet Prep HPF POC: NONREACTIVE — AB

## 2021-05-14 LAB — ABO/RH: ABO/RH(D): B POS

## 2021-05-14 LAB — HCG, QUANTITATIVE, PREGNANCY: hCG, Beta Chain, Quant, S: 10566 m[IU]/mL — ABNORMAL HIGH (ref <5)

## 2021-05-14 LAB — URINALYSIS, ROUTINE W REFLEX MICROSCOPIC
Bilirubin Urine: NEGATIVE
Glucose, UA: NEGATIVE mg/dL
Hgb urine dipstick: NEGATIVE
Ketones, ur: NEGATIVE mg/dL
Leukocytes,Ua: NEGATIVE
Nitrite: NEGATIVE
Protein, ur: NEGATIVE mg/dL
Specific Gravity, Urine: 1.013 (ref 1.005–1.030)
pH: 5 (ref 5.0–8.0)

## 2021-05-14 NOTE — MAU Provider Note (Signed)
History     CSN: [id number]  Arrival date and time: 05/14/21 1455   Event Date/Time   First Provider Initiated Contact with Patient 05/14/21 1559      Chief Complaint  Patient presents with   Vaginal Bleeding   26 y.o. G2P0010 @[redacted]w[redacted]d  by sure LMP presenting with spotting. Reports onset today. Spotting is brown and mucousy. Also reports intermittent abdominal cramps last night. Denies urinary sx. Denies GI sx. No recent IC.   OB History     Gravida  1   Para      Term      Preterm      AB      Living         SAB      IAB      Ectopic      Multiple      Live Births              Past Medical History:  Diagnosis Date   Hypothyroidism    Migraine    Pseudoseizure    Seizures ([place])     Past Surgical History:  Procedure Laterality Date   LYMPH NODE DISSECTION     TONSILLECTOMY  2006    Family History  Problem Relation Age of Onset   Lupus Mother    Diverticulitis Mother    Hypertension Father     Social History   Tobacco Use   Smoking status: Never   Smokeless tobacco: Never  Substance Use Topics   Alcohol use: Never   Drug use: Not Currently    Allergies:  Allergies  Allergen Reactions   Contrast Media [Iodinated Contrast Media]     Medications Prior to Admission  Medication Sig Dispense Refill Last Dose   fexofenadine (ALLEGRA) 180 MG tablet Take 180 mg by mouth daily.   05/14/2021   levothyroxine (SYNTHROID) 75 MCG tablet Take 1 tablet (75 mcg total) by mouth daily before breakfast. 30 tablet 3 05/14/2021   busPIRone (BUSPAR) 5 MG tablet Take 5 mg by mouth 3 (three) times daily. (Patient not taking: Reported on 05/10/2021)      lurasidone (LATUDA) 20 MG TABS tablet Take by mouth. (Patient not taking: Reported on 05/10/2021)      naproxen (NAPROSYN) 500 MG tablet Take 1 tablet (500 mg total) by mouth 2 (two) times daily. (Patient not taking: Reported on 05/10/2021) 30 tablet 0    ondansetron (ZOFRAN ODT) 4 MG disintegrating tablet Take  1 tablet (4 mg total) by mouth every 8 (eight) hours as needed for nausea or vomiting. (Patient not taking: Reported on 05/10/2021) 20 tablet 0    propranolol (INDERAL) 10 MG tablet Take 10 mg by mouth 3 (three) times daily. (Patient not taking: Reported on 05/10/2021)       Review of Systems  Gastrointestinal:  Positive for abdominal pain. Negative for constipation, diarrhea, nausea and vomiting.  Genitourinary:  Positive for vaginal bleeding. Negative for dysuria, frequency, hematuria and urgency.  Physical Exam   Blood pressure 119/68, pulse 81, temperature 98 F (36.7 C), temperature source Oral, resp. rate 15, last menstrual period 03/27/2021, SpO2 100 %.  Physical Exam Vitals and nursing note reviewed.  Constitutional:      General: She is not in acute distress.    Appearance: Normal appearance.  HENT:     Head: Normocephalic and atraumatic.  Cardiovascular:     Rate and Rhythm: Normal rate.  Pulmonary:     Effort: Pulmonary effort is normal. No respiratory distress.  Abdominal:     General: There is no distension.     Palpations: Abdomen is soft. There is no mass.     Tenderness: There is no abdominal tenderness. There is no guarding or rebound.     Hernia: No hernia is present.  Musculoskeletal:        General: Normal range of motion.     Cervical back: Normal range of motion.  Skin:    General: Skin is warm and dry.  Neurological:     General: No focal deficit present.     Mental Status: She is alert and oriented to person, place, and time.  Psychiatric:        Mood and Affect: Mood normal.        Behavior: Behavior normal.   Results for orders placed or performed during the hospital encounter of 05/14/21 (from the past 24 hour(s))  ABO/Rh     Status: None   Collection Time: 05/14/21  4:06 PM  Result Value Ref Range   ABO/RH(D) B POS    No rh immune globuloin      NOT A RH IMMUNE GLOBULIN CANDIDATE, PT RH POSITIVE Performed at [facility] [address], [place], [place] [address]   CBC     Status: None   Collection Time: 05/14/21  4:06 PM  Result Value Ref Range   WBC 6.8 4.0 - 10.5 K/uL   RBC 4.06 3.87 - 5.11 MIL/uL   Hemoglobin 12.4 12.0 - 15.0 g/dL   HCT 36.5 36.0 - 46.0 %   MCV 89.9 80.0 - 100.0 fL   MCH 30.5 26.0 - 34.0 pg   MCHC 34.0 30.0 - 36.0 g/dL   RDW 12.5 11.5 - 15.5 %   Platelets 209 150 - 400 K/uL   nRBC 0.0 0.0 - 0.2 %  hCG, quantitative, pregnancy     Status: Abnormal   Collection Time: 05/14/21  4:06 PM  Result Value Ref Range   hCG, Beta Chain, Quant, S 10,566 (H) <5 mIU/mL  Wet prep, genital     Status: Abnormal   Collection Time: 05/14/21  4:12 PM   Specimen: PATH Cytology Cervicovaginal Ancillary Only  Result Value Ref Range   Yeast Wet Prep HPF POC NON REACTIVE (A) NONE SEEN   Trich, Wet Prep NON REACTIVE (A) NONE SEEN   Clue Cells Wet Prep HPF POC PRESENT (A) NONE SEEN   WBC, Wet Prep HPF POC >=10 (A) <10   Sperm NON REACTIVE   Urinalysis, Routine w reflex microscopic Urine, Clean Catch     Status: None   Collection Time: 05/14/21  4:17 PM  Result Value Ref Range   Color, Urine YELLOW YELLOW   APPearance CLEAR CLEAR   Specific Gravity, Urine 1.013 1.005 - 1.030   pH 5.0 5.0 - 8.0   Glucose, UA NEGATIVE NEGATIVE mg/dL   Hgb urine dipstick NEGATIVE NEGATIVE   Bilirubin Urine NEGATIVE NEGATIVE   Ketones, ur NEGATIVE NEGATIVE mg/dL   Protein, ur NEGATIVE NEGATIVE mg/dL   Nitrite NEGATIVE NEGATIVE   Leukocytes,Ua NEGATIVE NEGATIVE    US OB LESS THAN 14 WEEKS WITH OB TRANSVAGINAL  Result Date: 05/14/2021 CLINICAL DATA:  Vaginal bleeding.  Positive beta HCG EXAM: OBSTETRIC <14 WK [place] AND TRANSVAGINAL OB US TECHNIQUE: Both transabdominal and transvaginal ultrasound examinations were performed for complete evaluation of the gestation as well as the maternal uterus, adnexal regions, and pelvic cul-de-sac. Transvaginal technique was performed to assess early pregnancy. COMPARISON:  None.  FINDINGS: Intrauterine  gestational sac: Single Yolk sac:  Not Visualized. Embryo:  Visualized. Cardiac Activity: Visualized. Heart Rate: 97 bpm CRL:  3.4 mm   5 w   6 d                  [place] EDC: 01/08/2022 Subchorionic hemorrhage:  None visualized. Maternal uterus/adnexae: Normal appearance of the ovaries and adnexal regions. No free fluid within the pelvis. IMPRESSION: 1. Single live intrauterine gestation measuring 5 weeks 6 days by crown-rump length. 2. Fetal bradycardia with heart rate of 97 BPM. Recommend close clinical follow-up and short interval repeat ultrasound to assess viability. Electronically Signed   By: Davina Poke D.O.   On: 05/14/2021 17:35    MAU Course  Procedures  MDM Labs and [place] ordered and reviewed. Viable IUP with borderline bradycardia however measuring less than 6 weeks. Informed pt of results. Stable for discharge home.   Assessment and Plan   1. [redacted] weeks gestation of pregnancy   2. Vaginal bleeding in pregnancy   3. Blood type, Rh positive    Discharge home Follow up at [facility] as scheduled SAB precautions  Allergies as of 05/14/2021       Reactions   Contrast Media [iodinated Contrast Media]         Medication List     STOP taking these medications    busPIRone 5 MG tablet Commonly known as: BUSPAR   fexofenadine 180 MG tablet Commonly known as: ALLEGRA   Latuda 20 MG Tabs tablet Generic drug: lurasidone   naproxen 500 MG tablet Commonly known as: NAPROSYN   ondansetron 4 MG disintegrating tablet Commonly known as: Zofran ODT   propranolol 10 MG tablet Commonly known as: INDERAL       TAKE these medications    levothyroxine 75 MCG tablet Commonly known as: Synthroid Take 1 tablet (75 mcg total) by mouth daily before breakfast.        Julianne Handler, CNM 05/14/2021, 6:06 PM

## 2021-05-14 NOTE — MAU Note (Signed)
Pt reports she is [redacted] weeks pregnant and this am started having some spotting some brownish discharge. Had some mild cramping last pm.

## 2021-05-15 LAB — GC/CHLAMYDIA PROBE AMP (~~LOC~~) NOT AT ARMC
Chlamydia: NEGATIVE
Comment: NEGATIVE
Comment: NORMAL
Neisseria Gonorrhea: NEGATIVE

## 2021-05-24 ENCOUNTER — Other Ambulatory Visit: Payer: Self-pay

## 2021-05-24 ENCOUNTER — Inpatient Hospital Stay (HOSPITAL_COMMUNITY)
Admission: AD | Admit: 2021-05-24 | Discharge: 2021-05-25 | Disposition: A | Discharge status: Home / Self Care | Payer: BLUE CROSS/BLUE SHIELD | Attending: Obstetrics & Gynecology | Admitting: Obstetrics & Gynecology

## 2021-05-24 DIAGNOSIS — R519 Headache, unspecified: Secondary | ICD-10-CM | POA: Diagnosis not present

## 2021-05-24 DIAGNOSIS — Z3A01 Less than 8 weeks gestation of pregnancy: Secondary | ICD-10-CM | POA: Diagnosis not present

## 2021-05-24 DIAGNOSIS — O98511 Other viral diseases complicating pregnancy, first trimester: Secondary | ICD-10-CM | POA: Insufficient documentation

## 2021-05-24 DIAGNOSIS — U071 COVID-19: Secondary | ICD-10-CM | POA: Insufficient documentation

## 2021-05-24 DIAGNOSIS — F445 Conversion disorder with seizures or convulsions: Secondary | ICD-10-CM

## 2021-05-24 DIAGNOSIS — O26891 Other specified pregnancy related conditions, first trimester: Secondary | ICD-10-CM | POA: Diagnosis not present

## 2021-05-24 DIAGNOSIS — J028 Acute pharyngitis due to other specified organisms: Secondary | ICD-10-CM | POA: Insufficient documentation

## 2021-05-24 DIAGNOSIS — R569 Unspecified convulsions: Secondary | ICD-10-CM | POA: Insufficient documentation

## 2021-05-24 DIAGNOSIS — O99891 Other specified diseases and conditions complicating pregnancy: Secondary | ICD-10-CM | POA: Insufficient documentation

## 2021-05-24 LAB — URINALYSIS, ROUTINE W REFLEX MICROSCOPIC
Bacteria, UA: NONE SEEN
Bilirubin Urine: NEGATIVE
Glucose, UA: NEGATIVE mg/dL
Hgb urine dipstick: NEGATIVE
Ketones, ur: NEGATIVE mg/dL
Nitrite: NEGATIVE
Protein, ur: NEGATIVE mg/dL
Specific Gravity, Urine: 1.011 (ref 1.005–1.030)
pH: 6 (ref 5.0–8.0)

## 2021-05-24 MED ORDER — CYCLOBENZAPRINE HCL 5 MG PO TABS
10.0000 mg | ORAL_TABLET | Freq: Once | ORAL | Status: AC
  Administered 2021-05-24: 10 mg via ORAL
  Filled 2021-05-24: qty 2

## 2021-05-24 MED ORDER — ACETAMINOPHEN 500 MG PO TABS
1000.0000 mg | ORAL_TABLET | Freq: Once | ORAL | Status: AC
  Administered 2021-05-24: 1000 mg via ORAL
  Filled 2021-05-24: qty 2

## 2021-05-24 NOTE — MAU Note (Signed)
Gerrit Heck CNM in Triage to see pt and discuss plan of care.  ?

## 2021-05-24 NOTE — MAU Provider Note (Signed)
?History  ?  ? ?CSN: [id number] ? ?Arrival date and time: 05/24/21 2207 ? ? Event Date/Time  ? First Provider Initiated Contact with Patient 05/24/21 2315   ?  ? ?Chief Complaint  ?Patient presents with  ? Seizures  ? Back Pain  ? Headache  ? ?Audrey Arnold is a 26 y.o. G1P0 at [redacted]w[redacted]d who receives care at [facility].  She presents  today for Seizures, Back Pain, and Headache.  She reports having a seizure around 2030 in which she lost consciousness stating "I had the tray in my hand and I had to pick the tray up."  Patient states she did not hit her head during her event.  She reports she has had a HA throughout the day starting around 10am that has progressively worsened.  She states the pain is located "in my whole head, but especially in the front and makes my eyes hurt."  She describes the pain as "pounding" and reports movement makes it worse, while identifying no relieving factors. She rates her HA a 9/10.  She states she usually takes Excedrin for her HA, but has not due to pregnancy. She denies vaginal bleeding.  She reports whole body aches and states it is worse in her lower back.  She rates the pain a 6/10.  She also reports some occasional cramping.  ? ? ?OB History   ? ? Gravida  ?1  ? Para  ?   ? Term  ?   ? Preterm  ?   ? AB  ?   ? Living  ?   ?  ? ? SAB  ?   ? IAB  ?   ? Ectopic  ?   ? Multiple  ?   ? Live Births  ?   ?   ?  ?  ? ? ?Past Medical History:  ?Diagnosis Date  ? Hypothyroidism   ? Migraine   ? Pseudoseizure   ? Seizures ([place])   ? ? ?Past Surgical History:  ?Procedure Laterality Date  ? LYMPH NODE DISSECTION    ? TONSILLECTOMY  2006  ? ? ?Family History  ?Problem Relation Age of Onset  ? Lupus Mother   ? Diverticulitis Mother   ? Hypertension Father   ? ? ?Social History  ? ?Tobacco Use  ? Smoking status: Never  ? Smokeless tobacco: Never  ?Substance Use Topics  ? Alcohol use: Never  ? Drug use: Not Currently  ? ? ?Allergies:  ?Allergies  ?Allergen Reactions  ? Contrast Media [Iodinated Contrast  Media]   ? ? ?Medications Prior to Admission  ?Medication Sig Dispense Refill Last Dose  ? levothyroxine (SYNTHROID) 75 MCG tablet Take 1 tablet (75 mcg total) by mouth daily before breakfast. 30 tablet 3   ? ? ?Review of Systems  ?Eyes:  Negative for visual disturbance.  ?Gastrointestinal:  Negative for abdominal pain, nausea and vomiting.  ?Genitourinary:  Negative for difficulty urinating, dysuria, vaginal bleeding and vaginal discharge.  ?Musculoskeletal:  Positive for back pain (Lower; "achy").  ?Neurological:  Positive for headaches. Negative for dizziness and light-headedness.  ?Physical Exam  ? ?Blood pressure 121/74, pulse (!) 104, temperature 98.3 ?F (36.8 ?C), resp. rate 17, height 5\' 4"  (1.626 m), weight 59.9 kg, last menstrual period 03/27/2021, SpO2 100 %. ? ?Physical Exam ?Constitutional:   ?   Appearance: She is well-developed.  ?HENT:  ?   Head: Normocephalic and atraumatic.  ?Cardiovascular:  ?   Rate and Rhythm: Regular rhythm. Tachycardia present.  ?  Pulmonary:  ?   Effort: Pulmonary effort is normal. No respiratory distress.  ?Abdominal:  ?   General: Bowel sounds are normal.  ?   Palpations: Abdomen is soft.  ?   Tenderness: There is no abdominal tenderness.  ?Musculoskeletal:  ?   Cervical back: Normal range of motion.  ?Skin: ?   General: Skin is warm and dry.  ?Neurological:  ?   Mental Status: She is alert and oriented to person, place, and time.  ?Psychiatric:     ?   Mood and Affect: Mood normal.     ?   Behavior: Behavior normal.  ? ? ?MAU Course  ?Procedures ?Results for orders placed or performed during the hospital encounter of 05/24/21 (from the past 24 hour(s))  ?Urinalysis, Routine w reflex microscopic Urine, Clean Catch     Status: Abnormal  ? Collection Time: 05/24/21 10:57 PM  ?Result Value Ref Range  ? Color, Urine STRAW (A) YELLOW  ? APPearance CLEAR CLEAR  ? Specific Gravity, Urine 1.011 1.005 - 1.030  ? pH 6.0 5.0 - 8.0  ? Glucose, UA NEGATIVE NEGATIVE mg/dL  ? Hgb urine  dipstick NEGATIVE NEGATIVE  ? Bilirubin Urine NEGATIVE NEGATIVE  ? Ketones, ur NEGATIVE NEGATIVE mg/dL  ? Protein, ur NEGATIVE NEGATIVE mg/dL  ? Nitrite NEGATIVE NEGATIVE  ? Leukocytes,Ua SMALL (A) NEGATIVE  ? RBC / HPF 0-5 0 - 5 RBC/hpf  ? WBC, UA 0-5 0 - 5 WBC/hpf  ? Bacteria, UA NONE SEEN NONE SEEN  ? Squamous Epithelial / LPF 0-5 0 - 5  ? ? ?MDM ?Exam ?UA ?Pain Medication ?Assessment and Plan  ?26 year old, G1P0  ?SIUP at 7.2 weeks ?Headache ?PseudoSeizures ? ?-Reviewed POC with patient. ?-Exam performed.  ?-Discussed usage of muscle relaxant and tylenol for treatment of HA ?-Informed that with known history of P.Seizures and in absence of head trauma no formal evaluation will be completed regarding this. ?-Patient questions if baby is okay.  Informed that provider can not speak to this, but does know that follow up US is necessary considering bradycardia on initial scan. ?-Will send message to office regarding need for viability scan at appt on March 24th. ?-Patient agreeable. ?-Will give medication and reassess.  ? ?Maryann Conners ?05/24/2021, 11:16 PM  ? ?Reassessment (12:07 AM) ? ?-Patient expresses desire for discharge. ?-Reports some improvement in pain. ?-Patient to follow up as scheduled. ?-Message sent to office. ?-Discharged to home in stable condition. ? ?Maryann Conners MSN, CNM ?Energy manager, [employer] for [employer] ? ? ?

## 2021-05-24 NOTE — MAU Note (Signed)
I had a seizure at work about 2045. I had my friend bring me to the hospital to be sure the baby is ok. All I remember is I picked up a tray and then remember waking up. They told me someone caught me just before I hit the ground. I have had a headache all day. I have a seizure history. No VB. Have occ cramping in lower abd and my back hurts. Body is sore since the seizure.  ?

## 2021-05-25 ENCOUNTER — Encounter (HOSPITAL_COMMUNITY): Payer: Self-pay

## 2021-05-25 ENCOUNTER — Other Ambulatory Visit: Payer: Self-pay | Admitting: Lactation Services

## 2021-05-25 ENCOUNTER — Telehealth: Payer: Self-pay | Admitting: Lactation Services

## 2021-05-25 ENCOUNTER — Encounter: Payer: Self-pay | Admitting: Obstetrics and Gynecology

## 2021-05-25 ENCOUNTER — Ambulatory Visit (HOSPITAL_COMMUNITY)
Admission: EM | Admit: 2021-05-25 | Discharge: 2021-05-25 | Disposition: A | Discharge status: Home / Self Care | Payer: BLUE CROSS/BLUE SHIELD | Source: Home / Self Care | Attending: Internal Medicine | Admitting: Internal Medicine

## 2021-05-25 DIAGNOSIS — Z3A01 Less than 8 weeks gestation of pregnancy: Secondary | ICD-10-CM | POA: Insufficient documentation

## 2021-05-25 DIAGNOSIS — O3680X0 Pregnancy with inconclusive fetal viability, not applicable or unspecified: Secondary | ICD-10-CM

## 2021-05-25 DIAGNOSIS — J029 Acute pharyngitis, unspecified: Secondary | ICD-10-CM

## 2021-05-25 DIAGNOSIS — O26891 Other specified pregnancy related conditions, first trimester: Secondary | ICD-10-CM

## 2021-05-25 DIAGNOSIS — R519 Headache, unspecified: Secondary | ICD-10-CM

## 2021-05-25 DIAGNOSIS — J028 Acute pharyngitis due to other specified organisms: Secondary | ICD-10-CM | POA: Insufficient documentation

## 2021-05-25 DIAGNOSIS — O98511 Other viral diseases complicating pregnancy, first trimester: Secondary | ICD-10-CM | POA: Insufficient documentation

## 2021-05-25 DIAGNOSIS — O99891 Other specified diseases and conditions complicating pregnancy: Secondary | ICD-10-CM | POA: Diagnosis not present

## 2021-05-25 DIAGNOSIS — U071 COVID-19: Secondary | ICD-10-CM | POA: Insufficient documentation

## 2021-05-25 DIAGNOSIS — R569 Unspecified convulsions: Secondary | ICD-10-CM | POA: Insufficient documentation

## 2021-05-25 LAB — SARS CORONAVIRUS 2 (TAT 6-24 HRS): SARS Coronavirus 2: POSITIVE — AB

## 2021-05-25 MED ORDER — CYCLOBENZAPRINE HCL 10 MG PO TABS: 10.0000 mg | ORAL_TABLET | Freq: Two times a day (BID) | ORAL | 0 refills | Status: DC | PRN

## 2021-05-25 NOTE — Progress Notes (Signed)
Pt's friend came to desk and stated pt just wants to go home and lay down and try to sleep. Gerrit Heck CNM aware and will put in  d/c papers. WRitten and verbal d/c instructions given and understanding voiced ?

## 2021-05-25 NOTE — Discharge Instructions (Addendum)
Warm salt water gargle ?Chloraseptic throat spray will help with sore throat ?Maintain adequate hydration ?Take Tylenol as needed for pain and/or fever. ?If you experience persistent cough, shortness of breath, abdominal pain or vaginal bleeding please return to urgent care immediately to be reevaluated ?We will call you with recommendations if labs are abnormal ? ?

## 2021-05-25 NOTE — Telephone Encounter (Signed)
Called MFM to schedule follow up viability [place] on 3/24.  Was informed order was incorrect. Entered correct order.  ? ?Called Radiology to schedule [place]. Spoke with Byrd Hesselbach. Scheduled for 9 am on 3/16 for patient convenience.  She is to arrive 30 minutes early with full bladder.  ? ?Patient called in wanting to come in to be checked as felt she was dismissed in MAU yesterday in regards to baby being ok, informed patient we would call her back in a few minutes after [place] scheduled.  ? ?Called patient and informed her [place] is scheduled 3/16 at 9 am. Informed to arrive early with full bladder, patient voiced understanding.  ? ?Reviewed with patient that if bleeding like a period or has severe abdominal pain she is to return to MAU for re-evaluation. Patient voiced understanding.  ? ?Patient is experiencing of sneezing, sore throat and chest congestion today. She is planning to go to Urgent Care to be evaluated.   ?

## 2021-05-25 NOTE — ED Provider Notes (Addendum)
?[facility] ? ? ? ?CSN: [id number] ?Arrival date & time: 05/25/21  1205 ? ? ?  ? ?History   ?Chief Complaint ?Chief Complaint  ?Patient presents with  ? Headache  ? ? ?HPI ?Audrey Arnold is a 26 y.o. female currently [redacted] weeks pregnant comes to urgent care with complaints of headache, sore throat of 1 day duration.  Patient's symptoms started yesterday.  She has a history of pseudoseizures and apparently had 1 episode of falls at work yesterday.  She was seen and evaluated at the maternity assessment unit and discharged home.  She comes to the urgent care today with complaints of worsening headaches and sore throat.  No nausea, vomiting or diarrhea.  No fever or chills.  No generalized body aches.  He has not nasal congestion.  No sick contacts.  No shortness of breath, cough or sputum production.  No chest pain or chest pressure.  ? ?HPI ? ?Past Medical History:  ?Diagnosis Date  ? Hypothyroidism   ? Migraine   ? Pseudoseizure   ? Seizures ([facility])   ? ? ?Patient Active Problem List  ? Diagnosis Date Noted  ? Nonepileptic episode ([facility]) 11/15/2017  ? Hypothyroidism 06/09/2015  ? 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 ? ?Past Surgical History:  ?Procedure Laterality Date  ? LYMPH NODE DISSECTION    ? TONSILLECTOMY  2006  ? ? ?OB History   ? ? Gravida  ?1  ? Para  ?   ? Term  ?   ? Preterm  ?   ? AB  ?   ? Living  ?   ?  ? ? SAB  ?   ? IAB  ?   ? Ectopic  ?   ? Multiple  ?   ? Live Births  ?   ?   ?  ?  ? ? ? ?Home Medications   ? ?Prior to Admission medications   ?Medication Sig Start Date End Date Taking? Authorizing Provider  ?cyclobenzaprine (FLEXERIL) 10 MG tablet Take 1 tablet (10 mg total) by mouth 2 (two) times daily as needed for muscle spasms. 05/25/21   Gerrit Heck, CNM  ?levothyroxine (SYNTHROID) 75 MCG tablet Take 1 tablet (75 mcg total) by mouth daily before breakfast. 05/11/21   Milas Hock, MD  ? ? ?Family History ?Family History  ?Problem  Relation Age of Onset  ? Lupus Mother   ? Diverticulitis Mother   ? Hypertension Father   ? ? ?Social History ?Social History  ? ?Tobacco Use  ? Smoking status: Never  ? Smokeless tobacco: Never  ?Substance Use Topics  ? Alcohol use: Never  ? Drug use: Not Currently  ? ? ? ?Allergies   ?Contrast media [iodinated contrast media] ? ? ?Review of Systems ?Review of Systems ?As per HPI ? ?Physical Exam ?Triage Vital Signs ?ED Triage Vitals  ?Enc Vitals Group  ?   BP 05/25/21 1321 120/75  ?   Pulse Rate 05/25/21 1321 99  ?   Resp 05/25/21 1321 18  ?   Temp 05/25/21 1321 98.6 ?F (37 ?C)  ?   Temp Source 05/25/21 1321 Oral  ?   SpO2 05/25/21 1321 100 %  ?   Weight --   ?   Height --   ?   Head Circumference --   ?   Peak Flow --   ?   Pain Score 05/25/21 1319 9  ?   Pain Loc --   ?  Pain Edu? --   ?   Excl. in GC? --   ? ?No data found. ? ?Updated Vital Signs ?BP 120/75 (BP Location: Right Arm)   Pulse 99   Temp 98.6 ?F (37 ?C) (Oral)   Resp 18   LMP 03/27/2021 (Exact Date)   SpO2 100%  ? ?Visual Acuity ?Right Eye Distance:   ?Left Eye Distance:   ?Bilateral Distance:   ? ?Right Eye Near:   ?Left Eye Near:    ?Bilateral Near:    ? ?Physical Exam ?Vitals and nursing note reviewed.  ?Constitutional:   ?   General: She is not in acute distress. ?   Appearance: She is not ill-appearing.  ?HENT:  ?   Head: Normocephalic.  ?   Mouth/Throat:  ?   Mouth: Mucous membranes are moist.  ?Eyes:  ?   Extraocular Movements: Extraocular movements intact.  ?Neck:  ?   Comments: Posterior pharyngeal wall erythema. ?Cardiovascular:  ?   Rate and Rhythm: Normal rate and regular rhythm.  ?   Heart sounds: Normal heart sounds.  ?Pulmonary:  ?   Effort: Pulmonary effort is normal.  ?   Breath sounds: Normal breath sounds.  ?Abdominal:  ?   General: Bowel sounds are normal.  ?   Palpations: Abdomen is soft.  ?Musculoskeletal:  ?   Cervical back: Normal range of motion and neck supple.  ?Neurological:  ?   Mental Status: She is alert.  ?    GCS: GCS eye subscore is 4. GCS verbal subscore is 5. GCS motor subscore is 6.  ? ? ? ?UC Treatments / Results  ?Labs ?(all labs ordered are listed, but only abnormal results are displayed) ?Labs Reviewed  ?SARS CORONAVIRUS 2 (TAT 6-24 HRS)  ? ? ?EKG ? ? ?Radiology ?No results found. ? ?Procedures ?Procedures (including critical care time) ? ?Medications Ordered in UC ?Medications - No data to display ? ?Initial Impression / Assessment and Plan / UC Course  ?I have reviewed the triage vital signs and the nursing notes. ? ?Pertinent labs & imaging results that were available during my care of the patient were reviewed by me and considered in my medical decision making (see chart for details). ? ?  ? ?1.  Acute viral pharyngitis: ?COVID-19 PCR test has been sent ?Increase oral fluid intake ?Tylenol as needed for pain and/or fever ?Patient is advised to follow-up with gynecologist ?Go to the MAU if "seizure" episode occurs again. ?Return to urgent care if you have any other concerns. ?Final Clinical Impressions(s) / UC Diagnoses  ? ?Final diagnoses:  ?Acute viral pharyngitis  ? ? ? ?Discharge Instructions   ? ?  ?Warm salt water gargle ?Chloraseptic throat spray will help with sore throat ?Maintain adequate hydration ?Take Tylenol as needed for pain and/or fever. ?If you experience persistent cough, shortness of breath, abdominal pain or vaginal bleeding please return to urgent care immediately to be reevaluated ?We will call you with recommendations if labs are abnormal ? ? ? ?05/25/2021 at 6:51 PM: ?I spoke with the patient about the discharge instructions.  I instructed her not to take any NSAIDs or Motrin as the discharge instructions initially stated.  This is because the patient is [redacted] weeks pregnant.  Patient verbalized understanding and agrees with not taking any NSAIDs.  She requested a work note during the phone call.  Work note will be printed and left at the registration desk for her to pick up. ?ED  Prescriptions   ?None ?  ? ?  PDMP not reviewed this encounter. ?  ?Merrilee JanskyLamptey, Melesa Lecy O, MD ?05/25/21 1848 ? ?  ?Merrilee JanskyLamptey, Manal Kreutzer O, MD ?05/25/21 [phone] ? ?

## 2021-05-25 NOTE — Discharge Instructions (Signed)

## 2021-05-25 NOTE — Addendum Note (Signed)
Addended by: Ed Blalock on: 05/25/2021 11:35 AM ? ? Modules accepted: Orders ? ?

## 2021-05-25 NOTE — Telephone Encounter (Signed)
-----   Message from Gerrit Heck, [place] sent at 05/25/2021 12:09 AM EST ----- ?Regarding: Ultrasoun ?This patient said she has an appt for the 24th.  Can you please make sure she has a viability Korea that day too.  Her initial scan showed an IUP with bradycardia.   ? ?Thanks, ? ?Shanda Bumps ? ?

## 2021-05-25 NOTE — ED Triage Notes (Signed)
Onset yesterday of HA, body aches and scratchy throat. Yesterday, Pt had a seizure at work, [facility] and tylenol. Pt reports her sxs worsened upon waking today with a new onset of congestion. No v/d. Pt reports a h/o seizures, noting her last activity was approx 1.5 years ago. ? ? ?

## 2021-05-26 ENCOUNTER — Telehealth (HOSPITAL_COMMUNITY): Payer: Self-pay | Admitting: Internal Medicine

## 2021-05-26 MED ORDER — NIRMATRELVIR/RITONAVIR (PAXLOVID)TABLET
3.0000 | ORAL_TABLET | Freq: Two times a day (BID) | ORAL | 0 refills | Status: AC
Start: 2021-05-26 — End: 2021-05-31

## 2021-05-26 NOTE — Telephone Encounter (Signed)
Paxlovid was sent to pharmacy on file.  ?

## 2021-05-30 ENCOUNTER — Telehealth (INDEPENDENT_AMBULATORY_CARE_PROVIDER_SITE_OTHER): Payer: BLUE CROSS/BLUE SHIELD

## 2021-05-30 DIAGNOSIS — Z3A00 Weeks of gestation of pregnancy not specified: Secondary | ICD-10-CM

## 2021-05-30 DIAGNOSIS — Z3480 Encounter for supervision of other normal pregnancy, unspecified trimester: Secondary | ICD-10-CM | POA: Insufficient documentation

## 2021-05-30 MED ORDER — PRENATAL PLUS 27-1 MG PO TABS: 1.0000 | ORAL_TABLET | Freq: Every day | ORAL | 11 refills | Status: AC

## 2021-05-30 NOTE — Patient Instructions (Signed)
AREA PEDIATRIC/FAMILY PRACTICE PHYSICIANS ? ?[place] ([address]) ?[facility] ?Chambliss, MD; Eniola, MD; Hale, MD; Hensel, MD; McDiarmid, MD; McIntyer, MD; Fayne Mcguffee, MD; Walden, MD ?[address] [place], [place] [address] ?[phone] ?Mon-Fri 8:30-12:30, 1:30-5:00 ?Providers come to see babies at [facility] ?Accepting Medicaid ?[facility] ?Limited providers who accept newborns: Koirala, MD; Morrow, MD; Wolters, MD ?[address], [place], [place] [address] ?[phone] ?Mon-Fri 8:00-5:30 ?Babies seen by providers at [facility] ?Does NOT accept Medicaid ?Please call early in hospitalization for appointment (limited availability)  ?[facility] ?Mulberry, MD ?[address] [place], [place] [address] ?[phone] ?Mon, Tue, Thur, Fri 8:30-5:00, Wed 10:00-7:00 (closed 1-2pm) ?Babies seen by [facility] providers ?Accepting Medicaid ?[facility] Pediatric[facility] ?Rubin, MD ?[address], [place], [place] [address] ?[phone] ?Mon-Fri 8:30-5:00, Sat 8:30-12:00 ?Provider comes to see babies at [facility] ?Accepting Medicaid ?Must have been referred from current patients or contacted office prior to delivery ?[facility] ([facility]) ?Brown, MD; Chandler, MD; Ettefagh, MD; Grant, MD; Lester, MD; McCormick, MD; McQueen, MD; Prose, MD; Simha, MD; Stanley, MD; Stryffeler, NP; Tebben, NP ?[address], [place], [place] [address] ?[phone] ?Mon, Tue, Thur, Fri 8:30-5:30, Wed 9:30-5:30, Sat 8:30-12:30 ?Babies seen by [facility] providers ?Accepting Medicaid ?Only accepting infants of first-time parents or siblings of current patients ?Hospital discharge coordinator will make follow-up appointment ?Jack Amos ?[address], [place], [place]  [address] ?[phone]   Fax - [phone] ?[facility] ?[address], [place], [place]  [address] ?Phone - [phone]   Fax - [phone] ?Shilpa Gosrani ?[address], [place], [place]  [address] ?[phone] ? ?[place] ([address]) ?[facility] ?Bates, MD; Brassfield, MD; Cooper, Cox, MD; MD; Davis, MD; Dovico, MD; Ettefaugh, MD; Little, MD; Lowe, MD; Keiffer, MD; Melvin, MD; Sumner, MD; Williams, MD ?[address], [place], [place] [address] ?[phone] ?Mon-Fri 8:30-5:00 (extended evenings Mon-Thur as needed), Sat-Sun 10:00-1:00 ?Providers come to see babies at [facility] ?Accepting Medicaid for families of first-time babies and families with all children in the household age 3 and under. Must register with office prior to making appointment (M-F only). ?[facility] ?Henson, NP; Knapp, MD; Lalonde, MD; Tysinger, PA ?[address] [place], [place] [address] ?[phone] ?Mon-Fri 8:00-5:00 ?Babies seen by providers at [facility] ?Does NOT accept Medicaid/Commercial Insurance Only ?[facility] ([facility])  ?Artis, MD; Barnes, MD; Bratton, MD; Coccaro, MD; Lockett Gardner, MD; Kramer, MD; Marshall, MD; Netherton, MD; Poleto, MD; Skinner, MD ?[address] [place], [place] [address] ?[phone] ?Mon-Fri 8:30-5:30, Sat (Oct.-Mar.) 9:00-1:00 ?Babies seen by providers at [facility] ?Accepting Medicaid ? ?[place] ([address]) ?[facility] ?Reid, MD; Warner, MD ?[address], , [place] [address] ?[phone] ?Mon-Fri 8:30-5:00, Sat 8:30-12:00 ?Providers come to see babies at [facility] ?Does NOT accept Medicaid ?[facility] ?Becker, PA; Hagler, MD; Scifres, PA; Sun, MD; Swayne, MD ?[address], ,  [address] ?[phone] ?Mon-Fri 8:00-5:00 ?Babies seen by providers at [facility] ?Does NOT accept Medicaid ?Only accepting babies of parents who  are patients ?Please call early in hospitalization for appointment (limited availability) ? [facility] ?Clark, MD; Frye, MD; Kelleher, MD; Mack, NP; Miller, MD; O'[place], MD; Patterson, NP; Pudlo, MD; Puzio, MD; Thomas, MD; Tucker, MD; Twiselton, MD ?[address], [place], [place] [address] ?[phone] ?Mon-Fri 8:00-5:00, Sat 9:00-12:00 ?Providers come to see babies at [facility] ?Does NOT accept Medicaid ? ?[place] ([address]) ?[facility] ?Limited providers accepting new patients: Brake, NP; Wharton, PA ?[address], [place], [place] [address] ?[phone] ?Mon-Fri 8:00-5:00 ?Babies seen by providers at [facility] ?Does NOT accept Medicaid ?Only accepting babies of parents who are patients ?Please call early in hospitalization for appointment (limited availability) ?[facility] ?Gay, MD; Quinlan, MD ?[address] [place], [place] [address] ?[phone] (press [phone] to schedule appointment) ?Mon-Fri 8:00-5:00 ?Providers come to see babies at [facility] ?Does NOT accept Medicaid ?[facility] ?Mazer, MD ?[address] [place], [place] [address] ?[phone] ?Mon-Fri 8:30-5:00 (lunch 12:30-1:00), extended hours by appointment only Wed 5:00-6:30 ?Babies seen by [facility] providers ?Accepting Medicaid ?[facility] ?Banks, MD; Jordan, MD; Koberlein, MD ?[address], [place], [place] [address] ?[phone] ?Mon-Fri 8:00-5:00 ?Babies seen by [facility] providers ?Does NOT accept Medicaid ?[facility] ?Parker, MD; Hunter, MD; Wallace, DO ?[address] [place], [place] [address] ?[phone] ?Mon-Fri 8:00-5:00 ?Babies seen by [facility] providers ?Does NOT accept Medicaid ?[facility] ?Brandon, PA; Brecken, PA; Christy, NP; Dees, MD; DeClaire, MD; DeWeese, MD; Hansen, NP; Mills, NP; Parrish, NP; Smoot, NP; Summer, MD; Vapne,  MD ?[address] [place], [place] [address] ?[phone] ?Mon-Fri 8:30-5:00, Sat 10:00-1:00 ?Providers come to see babies at [facility] ?Does NOT accept Medicaid ?Free prenatal information session Tuesdays at 4:45pm ?[facility] ?Bouska, MD; Gordon, PA; Jeffery, PA; Weber, PA ?[address] [place] [address] ?[phone] ?Mon-Fri 7:30-5:30 ?Babies seen by [facility] providers ?[facility] ?[address], [place], [place]  [address] ?[phone]   Fax - [phone] ? ?[place] ([address]) ?[facility] ?Reese, MD ?[address] [place], [place] [address] ?[phone] ?Mon-Thur 8:00-6:00 ?Providers come to see babies at [facility] ?Accepting Medicaid ?[facility] ?Anderson, NP; Badger, MD; Beal, PA; Spencer, PA ?[address] [place], [place] [address] ?[phone] ?Mon-Thur 7:30-7:30, Fri 7:30-4:30 ?Babies seen by [facility] providers ?Accepting Medicaid ?[facility] ?Agbuya, MD; Klett, NP; Romgoolam, MD ?[address], [place], [place] [address] ?[phone] ?Mon-Fri 8:30-5:00, Sat 8:30-12:00 ?Providers come to see babies at [facility] ?Accepting Medicaid ?Must have ?Meet & Greet? appointment at office prior to delivery ?[facility] ([facility]) ?McCord, MD; Wallace, MD; Wood, MD ?[address], [place], [place] [address] ?[phone] ?Mon-Wed 8:00-6:00, Thur-Fri 8:00-5:00, Sat 9:00-12:00 ?Providers come to see babies at [facility] ?Does NOT accept Medicaid ?Only accepting siblings of current patients ?[facility]  ?[address], [place], [place]  [address] ?[phone]   Fax - [phone] ?[facility] ?[address], [place],   [address] ?[phone]   Fax -  [phone] ? ?[place] ([address]) ?[facility] ?Cirigliano, DO; Matthews, DO ?[address] [place],  [address] ?[phone] ?Mon-Fri 7:00-5:00 ?Babies seen by [facility]

## 2021-05-30 NOTE — Progress Notes (Signed)
New OB Intake ? ?I connected with  Audrey Arnold on 05/30/21 at 11:15 AM EDT by MyChart Video Visit and verified that I am speaking with the correct person using two identifiers. Nurse is located at [facility] and pt is located at [employer]. ? ?I discussed the limitations, risks, security and privacy concerns of performing an evaluation and management service by telephone and the availability of in person appointments. I also discussed with the patient that there may be a patient responsible charge related to this service. The patient expressed understanding and agreed to proceed. ? ?I explained I am completing New OB Intake today. We discussed her EDD of 01/08/22 that is based on U/S on 05/14/21. Pt is G2/P0. I reviewed her allergies, medications, Medical/Surgical/OB history, and appropriate screenings. I informed her of [facility] services. Based on history, this is a/an  pregnancy uncomplicated .  ? ?Patient Active Problem List  ? Diagnosis Date Noted  ? Nonepileptic episode (HCC) 11/15/2017  ? Hypothyroidism 06/09/2015  ? 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 ? ?Concerns addressed today ? ?Delivery Plans:  ?Plans to deliver at [facility].  ? ?Waterbirth candidate?  ? ?MyChart/Babyscripts ?MyChart access verified. I explained pt will have some visits in office and some virtually. Babyscripts instructions given and order placed. Patient verifies receipt of registration text/e-mail. Account successfully created and app downloaded. ? ?Blood Pressure Cuff  ?Patient has private insurance; instructed to purchase blood pressure cuff and bring to first prenatal appt. Explained after first prenatal appt pt will check weekly and document in Babyscripts. ? ?Weight scale: Patient does / does not  have weight scale. Weight scale ordered for patient to pick up from [employer].  ? ?Anatomy US ?Explained first scheduled Korea will be around 19 weeks. Anatomy US scheduled for 06/01/21 at  0900. Pt notified to arrive at 0845. ?Scheduled AFP lab only appointment if CenteringPregnancy pt for same day as anatomy US.  ? ?Labs ?Discussed Avelina Laine genetic screening with patient. Would like both Panorama and Horizon drawn at new OB visit.Also if interested in genetic testing, tell patient she will need AFP 15-21 weeks to complete genetic testing .Routine prenatal labs needed. ? ?Covid Vaccine ?Patient has covid vaccine.  ? ?Is patient a CenteringPregnancy candidate? Not a candidate  ? ?Is patient a Mom+Baby Combined Care candidate? Not a candidate    ? ?Informed patient of [facility] website  and placed link in her AVS.  ? ?Social Determinants of Health ?Food Insecurity: Patient denies food insecurity. ?WIC Referral: Patient is interested in referral to [facility].  ?Transportation: Patient denies transportation needs. ?Childcare: Discussed no children allowed at ultrasound appointments. Offered childcare services; patient declines childcare services at this time. ? ?Send link to Pregnancy Navigators ? ? ?Placed OB Box on problem list and updated ? ?First visit review ?I reviewed new OB appt with pt. I explained she will have a pelvic exam, ob bloodwork with genetic screening, and PAP smear. Explained pt will be seen by Edd Arbour, CNM at first visit; encounter routed to appropriate provider. Explained that patient will be seen by pregnancy navigator following visit with provider. [facility] information placed in AVS.  ? ?Audrey Arnold, CMA ?05/30/2021  11:25 AM  ?

## 2021-05-31 NOTE — Progress Notes (Signed)
Patient was assessed and managed by nursing staff during this encounter. I have reviewed the chart and agree with the documentation and plan.  ? ?Edd Arbour, MSN, CNM, IBCLC ?05/31/21 1:56 PM ? ?

## 2021-06-01 ENCOUNTER — Other Ambulatory Visit: Payer: Self-pay

## 2021-06-01 ENCOUNTER — Ambulatory Visit
Admission: RE | Admit: 2021-06-01 | Discharge: 2021-06-01 | Disposition: A | Discharge status: Home / Self Care | Payer: BLUE CROSS/BLUE SHIELD | Source: Ambulatory Visit

## 2021-06-01 DIAGNOSIS — O3680X0 Pregnancy with inconclusive fetal viability, not applicable or unspecified: Secondary | ICD-10-CM

## 2021-06-08 ENCOUNTER — Telehealth: Payer: Self-pay

## 2021-06-08 NOTE — Telephone Encounter (Signed)
Called patient to review insurance information. Pt has Blue Local plan which is not accepted by our office. Explained that if she has maternity benefit she should be seen within network at [facility] location. Explained if she does not have any benefit, our office would be a similar cost. Recommended patient apply for Medicaid as able.  ?

## 2021-06-09 ENCOUNTER — Encounter: Payer: No Typology Code available for payment source | Admitting: Certified Nurse Midwife

## 2021-06-09 ENCOUNTER — Other Ambulatory Visit: Payer: Self-pay

## 2021-07-13 ENCOUNTER — Encounter: Payer: Self-pay | Admitting: Family Medicine

## 2021-07-13 ENCOUNTER — Ambulatory Visit (INDEPENDENT_AMBULATORY_CARE_PROVIDER_SITE_OTHER): Payer: Medicaid Other | Admitting: Family Medicine

## 2021-07-13 ENCOUNTER — Other Ambulatory Visit (HOSPITAL_COMMUNITY)
Admission: RE | Admit: 2021-07-13 | Discharge: 2021-07-13 | Disposition: A | Discharge status: Home / Self Care | Payer: Medicaid Other | Source: Ambulatory Visit | Attending: Certified Nurse Midwife | Admitting: Certified Nurse Midwife

## 2021-07-13 VITALS — BP 121/67 | HR 85 | Wt 137.4 lb

## 2021-07-13 DIAGNOSIS — Z124 Encounter for screening for malignant neoplasm of cervix: Secondary | ICD-10-CM | POA: Insufficient documentation

## 2021-07-13 DIAGNOSIS — Z3480 Encounter for supervision of other normal pregnancy, unspecified trimester: Secondary | ICD-10-CM | POA: Diagnosis not present

## 2021-07-13 DIAGNOSIS — E039 Hypothyroidism, unspecified: Secondary | ICD-10-CM

## 2021-07-13 DIAGNOSIS — F445 Conversion disorder with seizures or convulsions: Secondary | ICD-10-CM | POA: Diagnosis not present

## 2021-07-13 LAB — RPR, EXTERNAL RESULT: RPR, External Result: NONREACTIVE

## 2021-07-13 LAB — RH FACTOR, EXTERNAL RESULT: Rh Factor, External Result: POSITIVE

## 2021-07-13 LAB — N. GONORRHOEAE, EXTERNAL RESULT: N. Gonorrhoeae, External Result: NEGATIVE

## 2021-07-13 LAB — HIV, EXTERNAL RESULT: HIV, External Result: NONREACTIVE

## 2021-07-13 LAB — RUBELLA TITER, EXTERNAL RESULT: Rubella Titer, External Result: <0.9

## 2021-07-13 LAB — C. TRACHOMATIS, EXTERNAL RESULT: C. Trachomatis, External Result: NEGATIVE

## 2021-07-13 LAB — HEPATITIS B, EXTERNAL RESULT: Hep B, External Result: NEGATIVE

## 2021-07-13 LAB — ABO, EXTERNAL RESULT

## 2021-07-13 LAB — HEPATITIS C ANTIBODY, EXTERNAL RESULT: Hepatitis C Antibody, External Result: NONREACTIVE

## 2021-07-13 MED ORDER — BLOOD PRESSURE KIT DEVI: 1.0000 | Freq: Once | 0 refills | Status: AC | PRN

## 2021-07-13 MED ORDER — GOJJI WEIGHT SCALE MISC: 1.0000 | 0 refills | Status: AC | PRN

## 2021-07-14 LAB — CBC/D/PLT+RPR+RH+ABO+RUBIGG...
Antibody Screen: NEGATIVE
Basophils Absolute: 0 10*3/uL (ref 0.0–0.2)
Basos: 0 %
EOS (ABSOLUTE): 0.1 10*3/uL (ref 0.0–0.4)
Eos: 1 %
HCV Ab: NONREACTIVE
HIV Screen 4th Generation wRfx: NONREACTIVE
Hematocrit: 40 % (ref 34.0–46.6)
Hemoglobin: 13.4 g/dL (ref 11.1–15.9)
Hepatitis B Surface Ag: NEGATIVE
Immature Grans (Abs): 0 10*3/uL (ref 0.0–0.1)
Immature Granulocytes: 0 %
Lymphocytes Absolute: 2 10*3/uL (ref 0.7–3.1)
Lymphs: 20 %
MCH: 30.4 pg (ref 26.6–33.0)
MCHC: 33.5 g/dL (ref 31.5–35.7)
MCV: 91 fL (ref 79–97)
Monocytes Absolute: 0.7 10*3/uL (ref 0.1–0.9)
Monocytes: 7 %
Neutrophils Absolute: 7.1 10*3/uL — ABNORMAL HIGH (ref 1.4–7.0)
Neutrophils: 72 %
Platelets: 247 10*3/uL (ref 150–450)
RBC: 4.41 x10E6/uL (ref 3.77–5.28)
RDW: 12.7 % (ref 11.7–15.4)
RPR Ser Ql: NONREACTIVE
Rh Factor: POSITIVE
Rubella Antibodies, IGG: <0.9 index — ABNORMAL LOW (ref >0.99)
WBC: 9.9 10*3/uL (ref 3.4–10.8)

## 2021-07-14 LAB — HCV INTERPRETATION

## 2021-07-14 LAB — TSH: TSH: 1.66 u[IU]/mL (ref 0.450–4.500)

## 2021-07-14 NOTE — Progress Notes (Signed)
?  ? ?Subjective:  ? ?Audrey Arnold is a 26 y.o. G2P0010 at [redacted]w[redacted]d by early ultrasound being seen today for her first obstetrical visit.  Her obstetrical history is not significant. Patient does intend to breast feed. Pregnancy history fully reviewed. ? ?Patient reports fatigue. ? ?HISTORY: ?OB History  ?Gravida Para Term Preterm AB Living  ?2 0 0 0 1 0  ?SAB IAB Ectopic Multiple Live Births  ?1 0 0 0 0  ?  ?# Outcome Date GA Lbr Len/2nd Weight Sex Delivery Anes PTL Lv  ?2 Current           ?1 SAB 03/2017          ?  ?Past Medical History:  ?Diagnosis Date  ? Hypothyroidism   ? Migraine   ? Pseudoseizure   ? Seizures (HCC)   ? ?Past Surgical History:  ?Procedure Laterality Date  ? LYMPH NODE DISSECTION    ? TONSILLECTOMY  2006  ? ?Family History  ?Problem Relation Age of Onset  ? Lupus Mother   ? Diverticulitis Mother   ? Hypertension Father   ? ?Social History  ? ?Tobacco Use  ? Smoking status: Never  ? Smokeless tobacco: Never  ?Substance Use Topics  ? Alcohol use: Never  ? Drug use: Not Currently  ? ?Allergies  ?Allergen Reactions  ? Contrast Media [Iodinated Contrast Media]   ? ?Current Outpatient Medications on File Prior to Visit  ?Medication Sig Dispense Refill  ? levothyroxine (SYNTHROID) 75 MCG tablet Take 1 tablet (75 mcg total) by mouth daily before breakfast. 30 tablet 3  ? prenatal vitamin w/FE, FA (PRENATAL 1 + 1) 27-1 MG TABS tablet Take 1 tablet by mouth daily at 12 noon. 30 tablet 11  ? cyclobenzaprine (FLEXERIL) 10 MG tablet Take 1 tablet (10 mg total) by mouth 2 (two) times daily as needed for muscle spasms. (Patient not taking: Reported on 07/13/2021) 20 tablet 0  ? ?No current facility-administered medications on file prior to visit.  ? ? ? ?Exam  ? ?Vitals:  ? 07/13/21 1457  ?BP: 121/67  ?Pulse: 85  ?Weight: 137 lb 6.4 oz (62.3 kg)  ? ?Fetal Heart Rate (bpm): 156 ? ?Uterus:     ?Pelvic Exam: Perineum: no hemorrhoids, normal perineum  ? Vulva: normal external genitalia, no lesions  ? Vagina:   normal mucosa, normal discharge  ? Cervix: no lesions and normal, pap smear done.   ? Adnexa: normal adnexa and no mass, fullness, tenderness  ? Bony Pelvis: average  ?System: General: well-developed, well-nourished female in no acute distress  ? Breast:  normal appearance, no masses or tenderness  ? Skin: normal coloration and turgor, no rashes  ? Neurologic: oriented, normal, negative, normal mood  ? Extremities: normal strength, tone, and muscle mass, ROM of all joints is normal  ? HEENT PERRLA, extraocular movement intact and sclera clear, anicteric  ? Mouth/Teeth mucous membranes moist, pharynx normal without lesions and dental hygiene good  ? Neck supple and no masses  ? Cardiovascular: regular rate and rhythm  ? Respiratory:  no respiratory distress, normal breath sounds  ? Abdomen: soft, non-tender; bowel sounds normal; no masses,  no organomegaly  ? ?  ?Assessment:  ? ?Pregnancy: G2P0010 ?Patient Active Problem List  ? Diagnosis Date Noted  ? Supervision of other normal pregnancy, antepartum 05/30/2021  ? Hypothyroidism 06/09/2015  ? 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 ?  ?Plan:  ?1. Hypothyroidism, unspecified  type ?Repeat levels ?On synthroid ?- TSH ? ?2. Psychogenic nonepileptic seizure ?Not on meds, no recent activity ? ?3. Supervision of other normal pregnancy, antepartum ?- Genetic Screening ?- Culture, OB Urine ?- CBC/D/Plt+RPR+Rh+ABO+RubIgG... ?- [place] MFM OB DETAIL +14 WK; Future ?- CHL AMB BABYSCRIPTS SCHEDULE OPTIMIZATION ? ?4. Pap smear for cervical cancer screening ?- Cytology - PAP( [place]) ? ? ?Initial labs drawn. ?Continue prenatal vitamins. ?Genetic Screening discussed, NIPS: ordered. ?Ultrasound discussed; fetal anatomic survey: ordered. ?Problem list reviewed and updated. ?The nature of [facility] with multiple MDs and other Advanced Practice Providers was explained to  patient; also emphasized that residents, students are part of our team. ?Routine obstetric precautions reviewed. ?Return in about 4 weeks (around 08/10/2021) for [facility]. ? ? ?  ? ?

## 2021-07-15 LAB — URINE CULTURE, OB REFLEX: Organism ID, Bacteria: NO GROWTH

## 2021-07-15 LAB — CULTURE, OB URINE

## 2021-07-18 ENCOUNTER — Encounter: Payer: Self-pay | Admitting: Family Medicine

## 2021-07-18 DIAGNOSIS — O09899 Supervision of other high risk pregnancies, unspecified trimester: Secondary | ICD-10-CM | POA: Insufficient documentation

## 2021-07-18 LAB — CYTOLOGY - PAP
Chlamydia: NEGATIVE
Comment: NEGATIVE
Comment: NORMAL
Diagnosis: NEGATIVE
Neisseria Gonorrhea: NEGATIVE

## 2021-08-10 ENCOUNTER — Encounter: Payer: Self-pay | Admitting: Family Medicine

## 2021-08-10 ENCOUNTER — Ambulatory Visit (INDEPENDENT_AMBULATORY_CARE_PROVIDER_SITE_OTHER): Payer: Medicaid Other | Admitting: Family Medicine

## 2021-08-10 VITALS — BP 115/70 | HR 108 | Wt 139.0 lb

## 2021-08-10 DIAGNOSIS — Z3480 Encounter for supervision of other normal pregnancy, unspecified trimester: Secondary | ICD-10-CM

## 2021-08-10 DIAGNOSIS — G8929 Other chronic pain: Secondary | ICD-10-CM

## 2021-08-10 DIAGNOSIS — E038 Other specified hypothyroidism: Secondary | ICD-10-CM

## 2021-08-10 DIAGNOSIS — F445 Conversion disorder with seizures or convulsions: Secondary | ICD-10-CM

## 2021-08-10 DIAGNOSIS — Z2839 Other underimmunization status: Secondary | ICD-10-CM

## 2021-08-10 DIAGNOSIS — Z3A18 18 weeks gestation of pregnancy: Secondary | ICD-10-CM

## 2021-08-10 DIAGNOSIS — R12 Heartburn: Secondary | ICD-10-CM

## 2021-08-10 DIAGNOSIS — O09899 Supervision of other high risk pregnancies, unspecified trimester: Secondary | ICD-10-CM

## 2021-08-10 DIAGNOSIS — R519 Headache, unspecified: Secondary | ICD-10-CM

## 2021-08-10 MED ORDER — FAMOTIDINE 20 MG PO TABS
20.0000 mg | ORAL_TABLET | Freq: Every day | ORAL | 2 refills | Status: AC
Start: 2021-08-10 — End: ?

## 2021-08-10 MED ORDER — CYCLOBENZAPRINE HCL 5 MG PO TABS: 5.0000 mg | ORAL_TABLET | Freq: Two times a day (BID) | ORAL | 0 refills | Status: AC | PRN

## 2021-08-10 NOTE — Progress Notes (Signed)
    Subjective:  Audrey Arnold is a 26 y.o. G2P0010 at 69w3dbeing seen today for ongoing prenatal care.  She is currently monitored for the following issues for this low-risk pregnancy and has Hypothyroidism; Major depressive disorder, recurrent, moderate ([place]; Psychogenic nonepileptic seizure; Supervision of other normal pregnancy, antepartum; and Rubella non-immune status, antepartum on their problem list.  Patient reports no complaints today but had severe heartburn a few days ago and wants to know what she can take in the future. Tums didn't help much. [place] She also wants to know what she can use for migraines. Contractions: Not present. Vag. Bleeding: None.  Movement: Present. Denies leaking of fluid.   The following portions of the patient's history were reviewed and updated as appropriate: allergies, current medications, past family history, past medical history, past social history, past surgical history and problem list.   Objective:   Vitals:   08/10/21 1119  BP: 115/70  Pulse: (!) 108  Weight: 139 lb (63 kg)    Fetal Status: Fetal Heart Rate (bpm): 154   Movement: Present     General:  Alert, oriented and cooperative. Patient is in no acute distress.  Skin: Skin is warm and dry. No rash noted.   Cardiovascular: Normal heart rate noted  Respiratory: Normal respiratory effort, no problems with respiration noted  Abdomen: Soft, gravid, appropriate for gestational age. Pain/Pressure: Present     Pelvic:  Cervical exam deferred        Extremities: Normal range of motion.  Edema: None  Mental Status: Normal mood and affect. Normal behavior. Normal judgment and thought content. EOMI. Speech normal and smile symmetrical. Normal gait.     Assessment and Plan:  Pregnancy: G2P0010 at 145w3d1. Supervision of other normal pregnancy, antepartum Doing well. [facility] one [facility]  2. [redacted] weeks  gestation of pregnancy Anatomy USKoreacheduled for 6/7.   3. Psychogenic nonepileptic seizure No concerns currently.   4. Other specified hypothyroidism TSH on 4/27 1.6 with synthroid 7595mstarted in 04/2021. Continue this dose, recheck each trimester, likely next visit.   5. Rubella non-immune status, antepartum Offer MMR pp.   6. Acid Reflux  Avoid triggers if possible. Sent  in pepcid-- discussed she may take PRN or scheduled if frequent episodes.   7. Migraines  Chronic, present prior to pregnancy. Neurologically intact. Occurring more with pregnancy, sometimes manageable other times she has to rest. Discussed magnesium/B2 supplementation and tylenol/flexeril. Encouraged adequate hydration, massage, cool/warm washcloth on forehead. Informed to let us [place]ow if still struggling with these despite medication.   Preterm labor symptoms and general obstetric precautions including but not limited to vaginal bleeding, contractions, leaking of fluid and fetal movement were reviewed in detail with the patient. Please refer to After Visit Summary for other counseling recommendations.   Return in about 1 month (around 09/10/2021) for LROSwain [name]

## 2021-08-10 NOTE — Patient Instructions (Signed)
For prevention of migraines in pregnancy: -Magnesium, 400mg  by mouth, once daily -Vitamin B2, 400mg  by mouth, once daily  For treatment of migraines in pregnancy: -take medication at the first sign of the pain of a headache, or the first sign of your aura -start with 1000mg  Tylenol (do not exceed 4000mg  of Tylenol in 24hrs) -if the above regimen does not resolve your headache at all, please come to [facility] for additional treatment  Indigestion: Tums Maalox Mylanta Zantac  Pepcid

## 2021-08-21 ENCOUNTER — Other Ambulatory Visit: Payer: Self-pay

## 2021-08-21 ENCOUNTER — Ambulatory Visit: Payer: Medicaid Other

## 2021-08-23 ENCOUNTER — Ambulatory Visit: Payer: Medicaid Other | Attending: Family Medicine | Admitting: *Deleted

## 2021-08-23 ENCOUNTER — Ambulatory Visit (HOSPITAL_BASED_OUTPATIENT_CLINIC_OR_DEPARTMENT_OTHER): Payer: Medicaid Other

## 2021-08-23 VITALS — BP 118/67 | HR 79

## 2021-08-23 DIAGNOSIS — Z79890 Hormone replacement therapy (postmenopausal): Secondary | ICD-10-CM | POA: Insufficient documentation

## 2021-08-23 DIAGNOSIS — Z3A20 20 weeks gestation of pregnancy: Secondary | ICD-10-CM | POA: Insufficient documentation

## 2021-08-23 DIAGNOSIS — Z363 Encounter for antenatal screening for malformations: Secondary | ICD-10-CM | POA: Diagnosis not present

## 2021-08-23 DIAGNOSIS — E039 Hypothyroidism, unspecified: Secondary | ICD-10-CM | POA: Diagnosis not present

## 2021-08-23 DIAGNOSIS — O09899 Supervision of other high risk pregnancies, unspecified trimester: Secondary | ICD-10-CM

## 2021-08-23 DIAGNOSIS — O99282 Endocrine, nutritional and metabolic diseases complicating pregnancy, second trimester: Secondary | ICD-10-CM | POA: Insufficient documentation

## 2021-08-23 DIAGNOSIS — Z3480 Encounter for supervision of other normal pregnancy, unspecified trimester: Secondary | ICD-10-CM

## 2021-08-24 ENCOUNTER — Other Ambulatory Visit: Payer: Self-pay | Admitting: *Deleted

## 2021-08-24 ENCOUNTER — Ambulatory Visit: Payer: Medicaid Other

## 2021-08-24 DIAGNOSIS — O99282 Endocrine, nutritional and metabolic diseases complicating pregnancy, second trimester: Secondary | ICD-10-CM

## 2021-09-04 ENCOUNTER — Ambulatory Visit (INDEPENDENT_AMBULATORY_CARE_PROVIDER_SITE_OTHER): Payer: Medicaid Other | Admitting: Obstetrics & Gynecology

## 2021-09-04 VITALS — BP 125/78 | HR 100 | Wt 148.0 lb

## 2021-09-04 DIAGNOSIS — Z3482 Encounter for supervision of other normal pregnancy, second trimester: Secondary | ICD-10-CM

## 2021-09-04 DIAGNOSIS — Z2839 Other underimmunization status: Secondary | ICD-10-CM

## 2021-09-04 DIAGNOSIS — Z3A22 22 weeks gestation of pregnancy: Secondary | ICD-10-CM

## 2021-09-04 DIAGNOSIS — O09899 Supervision of other high risk pregnancies, unspecified trimester: Secondary | ICD-10-CM

## 2021-09-04 DIAGNOSIS — Z3480 Encounter for supervision of other normal pregnancy, unspecified trimester: Secondary | ICD-10-CM

## 2021-09-04 NOTE — Progress Notes (Signed)
   PRENATAL VISIT NOTE  Subjective:  Audrey Arnold is a 26 y.o. G2P0010 at [redacted]w[redacted]d being seen today for ongoing prenatal care.  She is currently monitored for the following issues for this low-risk pregnancy and has Hypothyroidism; Major depressive disorder, recurrent, moderate (HCC); Psychogenic nonepileptic seizure; Supervision of other normal pregnancy, antepartum; and Rubella non-immune status, antepartum on their problem list.  Patient reports  breast discomfort on right relieved with support .   . Vag. Bleeding: None.  Movement: Present. Denies leaking of fluid.   The following portions of the patient's history were reviewed and updated as appropriate: allergies, current medications, past family history, past medical history, past social history, past surgical history and problem list.   Objective:   Vitals:   09/04/21 1609  BP: 125/78  Pulse: 100  Weight: 148 lb (67.1 kg)    Fetal Status: Fetal Heart Rate (bpm): 148   Movement: Present     General:  Alert, oriented and cooperative. Patient is in no acute distress.  Skin: Skin is warm and dry. No rash noted.   Cardiovascular: Normal heart rate noted  Respiratory: Normal respiratory effort, no problems with respiration noted  Abdomen: Soft, gravid, appropriate for gestational age.  Pain/Pressure: Present     Pelvic: Cervical exam deferred        Extremities: Normal range of motion.  Edema: Trace  Mental Status: Normal mood and affect. Normal behavior. Normal judgment and thought content.   Assessment and Plan:  Pregnancy: G2P0010 at [redacted]w[redacted]d 1. Rubella non-immune status, antepartum Vaccine postpartum  2. Supervision of other normal pregnancy, antepartum   Preterm labor symptoms and general obstetric precautions including but not limited to vaginal bleeding, contractions, leaking of fluid and fetal movement were reviewed in detail with the patient. Please refer to After Visit Summary for other counseling recommendations.    Return in about 4 weeks (around 10/02/2021).  Future Appointments  Date Time Provider Department Center  09/18/2021  9:45 AM [facility] NURSE [facility]  09/18/2021 10:00 AM [facility] US1 [facility]    Scheryl Darter, MD

## 2021-09-12 ENCOUNTER — Telehealth: Payer: Self-pay

## 2021-09-18 ENCOUNTER — Ambulatory Visit: Payer: Medicaid Other | Admitting: *Deleted

## 2021-09-18 ENCOUNTER — Encounter: Payer: Self-pay | Admitting: *Deleted

## 2021-09-18 ENCOUNTER — Other Ambulatory Visit: Payer: Self-pay | Admitting: *Deleted

## 2021-09-18 ENCOUNTER — Ambulatory Visit: Payer: Medicaid Other | Attending: Obstetrics and Gynecology

## 2021-09-18 VITALS — BP 116/72 | HR 71

## 2021-09-18 DIAGNOSIS — O99282 Endocrine, nutritional and metabolic diseases complicating pregnancy, second trimester: Secondary | ICD-10-CM | POA: Insufficient documentation

## 2021-09-18 DIAGNOSIS — O99891 Other specified diseases and conditions complicating pregnancy: Secondary | ICD-10-CM | POA: Diagnosis not present

## 2021-09-18 DIAGNOSIS — E039 Hypothyroidism, unspecified: Secondary | ICD-10-CM

## 2021-09-18 DIAGNOSIS — O365920 Maternal care for other known or suspected poor fetal growth, second trimester, not applicable or unspecified: Secondary | ICD-10-CM

## 2021-09-18 DIAGNOSIS — O365990 Maternal care for other known or suspected poor fetal growth, unspecified trimester, not applicable or unspecified: Secondary | ICD-10-CM

## 2021-09-18 DIAGNOSIS — R569 Unspecified convulsions: Secondary | ICD-10-CM

## 2021-09-18 DIAGNOSIS — Z3A24 24 weeks gestation of pregnancy: Secondary | ICD-10-CM

## 2021-09-18 DIAGNOSIS — O99280 Endocrine, nutritional and metabolic diseases complicating pregnancy, unspecified trimester: Secondary | ICD-10-CM | POA: Insufficient documentation

## 2021-09-18 DIAGNOSIS — G40909 Epilepsy, unspecified, not intractable, without status epilepticus: Secondary | ICD-10-CM

## 2021-09-27 ENCOUNTER — Other Ambulatory Visit: Payer: Self-pay

## 2021-09-27 DIAGNOSIS — Z3480 Encounter for supervision of other normal pregnancy, unspecified trimester: Secondary | ICD-10-CM

## 2021-10-05 ENCOUNTER — Other Ambulatory Visit: Payer: Medicaid Other

## 2021-10-05 ENCOUNTER — Ambulatory Visit (INDEPENDENT_AMBULATORY_CARE_PROVIDER_SITE_OTHER): Payer: Medicaid Other | Admitting: Obstetrics and Gynecology

## 2021-10-05 VITALS — BP 104/66 | HR 73 | Wt 151.1 lb

## 2021-10-05 DIAGNOSIS — O09899 Supervision of other high risk pregnancies, unspecified trimester: Secondary | ICD-10-CM

## 2021-10-05 DIAGNOSIS — Z3480 Encounter for supervision of other normal pregnancy, unspecified trimester: Secondary | ICD-10-CM

## 2021-10-05 DIAGNOSIS — Z3A26 26 weeks gestation of pregnancy: Secondary | ICD-10-CM

## 2021-10-05 DIAGNOSIS — Z23 Encounter for immunization: Secondary | ICD-10-CM

## 2021-10-05 DIAGNOSIS — F445 Conversion disorder with seizures or convulsions: Secondary | ICD-10-CM

## 2021-10-05 DIAGNOSIS — Z2839 Other underimmunization status: Secondary | ICD-10-CM

## 2021-10-05 DIAGNOSIS — E038 Other specified hypothyroidism: Secondary | ICD-10-CM

## 2021-10-05 NOTE — Progress Notes (Signed)
   PRENATAL VISIT NOTE  Subjective:  Audrey Arnold is a 26 y.o. G2P0010 at [redacted]w[redacted]d being seen today for ongoing prenatal care.  She is currently monitored for the following issues for this low-risk pregnancy and has Hypothyroidism; Major depressive disorder, recurrent, moderate (HCC); Psychogenic nonepileptic seizure; Supervision of other normal pregnancy, antepartum; and Rubella non-immune status, antepartum on their problem list.  Patient doing well with no acute concerns today. She reports no complaints.  Contractions: Not present. Vag. Bleeding: None.  Movement: Present. Denies leaking of fluid.   The following portions of the patient's history were reviewed and updated as appropriate: allergies, current medications, past family history, past medical history, past social history, past surgical history and problem list. Problem list updated.  Objective:   Vitals:   10/05/21 0819  BP: 104/66  Pulse: 73  Weight: 151 lb 1.6 oz (68.5 kg)    Fetal Status: Fetal Heart Rate (bpm): 141 Fundal Height: 27 cm Movement: Present     General:  Alert, oriented and cooperative. Patient is in no acute distress.  Skin: Skin is warm and dry. No rash noted.   Cardiovascular: Normal heart rate noted  Respiratory: Normal respiratory effort, no problems with respiration noted  Abdomen: Soft, gravid, appropriate for gestational age.  Pain/Pressure: Present     Pelvic: Cervical exam deferred        Extremities: Normal range of motion.  Edema: Trace  Mental Status:  Normal mood and affect. Normal behavior. Normal judgment and thought content.   Assessment and Plan:  Pregnancy: G2P0010 at [redacted]w[redacted]d  1. Supervision of other normal pregnancy, antepartum Pt is moving to [place] and will continue her prenatal care there  - Tdap vaccine greater than or equal to 7yo IM  2. [redacted] weeks gestation of pregnancy   3. Other specified hypothyroidism Recheck of TSH in third trimester  - TSH + free T4  4. Rubella  non-immune status, antepartum Treat after delivery  5. Psychogenic nonepileptic seizure No report of seizure activity  Preterm labor symptoms and general obstetric precautions including but not limited to vaginal bleeding, contractions, leaking of fluid and fetal movement were reviewed in detail with the patient.  Please refer to After Visit Summary for other counseling recommendations.   No follow-ups on file.   Mariel Aloe, MD Faculty Attending Center for [facility]

## 2021-10-06 ENCOUNTER — Encounter

## 2021-10-06 DIAGNOSIS — Z3A28 28 weeks gestation of pregnancy: Secondary | ICD-10-CM

## 2021-10-06 LAB — RPR: RPR Ser Ql: NONREACTIVE

## 2021-10-06 LAB — CBC
Hematocrit: 36.9 % (ref 34.0–46.6)
Hemoglobin: 12.1 g/dL (ref 11.1–15.9)
MCH: 30 pg (ref 26.6–33.0)
MCHC: 32.8 g/dL (ref 31.5–35.7)
MCV: 92 fL (ref 79–97)
Platelets: 229 10*3/uL (ref 150–450)
RBC: 4.03 x10E6/uL (ref 3.77–5.28)
RDW: 12.6 % (ref 11.7–15.4)
WBC: 8.3 10*3/uL (ref 3.4–10.8)

## 2021-10-06 LAB — TSH+FREE T4
Free T4: 0.89 ng/dL (ref 0.82–1.77)
TSH: 3.96 u[IU]/mL (ref 0.450–4.500)

## 2021-10-06 LAB — GLUCOSE TOLERANCE, 2 HOURS W/ 1HR
Glucose, 1 hour: 78 mg/dL (ref 70–179)
Glucose, 2 hour: 46 mg/dL — ABNORMAL LOW (ref 70–152)
Glucose, Fasting: 61 mg/dL — ABNORMAL LOW (ref 70–91)

## 2021-10-06 LAB — HIV ANTIBODY (ROUTINE TESTING W REFLEX): HIV Screen 4th Generation wRfx: NONREACTIVE

## 2021-10-16 ENCOUNTER — Ambulatory Visit: Payer: Medicaid Other

## 2021-10-25 ENCOUNTER — Encounter: Payer: Medicaid Other | Admitting: Certified Nurse Midwife

## 2021-10-25 ENCOUNTER — Ambulatory Visit: Attending: Obstetrics & Gynecology

## 2021-10-25 DIAGNOSIS — Z3A28 28 weeks gestation of pregnancy: Secondary | ICD-10-CM

## 2021-10-30 ENCOUNTER — Ambulatory Visit: Admit: 2021-10-30 | Discharge: 2021-10-30 | Payer: MEDICAID | Attending: Obstetrics & Gynecology

## 2021-10-30 MED ORDER — LEVOTHYROXINE SODIUM 75 MCG PO TABS
75 MCG | ORAL_TABLET | Freq: Every day | ORAL | 1 refills | Status: AC
Start: 2021-10-30 — End: ?

## 2021-10-30 NOTE — Progress Notes (Signed)
Current pregnancy history:    Lynn Perry is a  26 y.o. female Unavailable No LMP recorded. Patient is pregnant..    Patient is a transfer from [facility] in [place], [place]. Her last prenatal appointment was approximately 2 weeks ago.   Her partner is with her today. He is a Education officer, environmental.        Ultrasound data:  She had an ultrasound performed with MFM 6/07/2023m which revealed a viable singleton pregnancy with a gestational age of 36w 2d giving an [place] of 01/08/2022.      Pregnancy symptoms:  Since her LMP she has experienced no concerning symptoms.  She denies dysuria, abnormal vaginal discharge, vaginal bleeding, contractions or cramping. She reports good fetal movement.       Past pregnancy history:  G1P0     _0  _1  _2  _3  _4  _5  _ _     Substance history: negative for alcohol, tobacco and street drugs.           Positive for nothing.  Exposure history:   There is/are no indoor cat/s in the home.  The patient was instructed to not change the cat litter.   She admits close contact with children on a regular basis.   She has had chicken pox or the vaccine in the past.   Patient denies issues with domestic violence.     Genetic Screening/Teratology Counseling: (Includes patient, baby's father, or anyone in either family with:)  53.  Patient's age >/= 27 at EDC?--no  2.  Thalassemia ([place], [place], [place], or Asian background): MCV<80?--no.     3.  Neural tube defect (meningomyelocele, spina bifida, anencephaly)?--no.   4.  Congenital heart defect?--no.  5.  Down syndrome?--no.   6.  Tay-Sachs (Jewish, [place] Canadian)?--no.   7.  Canavan's Disease?--no.   8.  Familial Dysautonomia?--no.   9.  Sickle cell disease or trait (African)?--no   The patient has not been tested for sickle trait  10.  Hemophilia or other blood disorders?--no.   11.  Muscular dystrophy?--no.  12.  Cystic fibrosis?--no.  13.  Huntington's Chorea?--no.  14.  Mental  retardation/autism (if yes was person tested for Fragile X)?--no.   15.  Other inherited genetic or chromosomal disorder?--no.   66.  Maternal metabolic disorder (DM, PKU, etc)?--no.  17.  Patient or FOB with a child with a birth defect not listed above?--no.  17a.  Patient or FOB with a birth defect themselves?--no.   18.  Recurrent pregnancy loss, or stillbirth?--no.  19.  Any medications since LMP other than prenatal vitamins (include vitamins, supplements, OTC meds, drugs, alcohol)?--no.  20.  Any other genetic/environmental exposure to discuss?--no.    Infection History:  1.  Lives with someone with TB or TB exposed?--no.   2.  Patient or partner has history of genital herpes?--no.  3.  Rash or viral illness since LMP?--no.    4.  History of STD (GC, CT, HPV, syphilis, HIV)?--no   5. Other: OTHER?      Past Medical History:   Diagnosis Date    Hypothyroidism     Major depressive disorder     Psychogenic nonepileptic seizure     Rubella non-immune status, antepartum      Past Surgical History:   Procedure Laterality Date    LYMPH NODE DISSECTION      TONSILLECTOMY Bilateral  2006     Social History     Occupational History    Not on file   Tobacco Use    Smoking status: Not on file    Smokeless tobacco: Not on file   Substance and Sexual Activity    Alcohol use: Not on file    Drug use: Not on file    Sexual activity: Not on file     Family History   Problem Relation Age of Onset    Lupus Mother         +Diverticulitis    Hypertension Father      OB History   Gravida Para Term Preterm AB Living   1             SAB IAB Ectopic Molar Multiple Live Births                    # Outcome Date GA Lbr Len/2nd Weight Sex Delivery Anes PTL Lv   1 Current              Allergies   Allergen Reactions    Contrast [Iodides]      Prior to Admission medications    Medication Sig Start Date End Date Taking? Authorizing Provider   cyclobenzaprine (FLEXERIL) 5 MG tablet Take 1 tablet by mouth 2 times daily as needed for Muscle spasms     Historical Provider, MD   famotidine (PEPCID) 20 MG tablet Take 1 tablet by mouth 2 times daily as needed    Historical Provider, MD   levothyroxine (SYNTHROID) 75 MCG tablet Take 1 tablet by mouth every morning (before breakfast)    Historical Provider, MD   Prenatal Vit-Fe Fumarate-FA (PRENATAL VITAMIN) 27-1 MG TABS tablet Take 1 tablet by mouth daily    Historical Provider, MD   Blood Pressure Monitoring (BLOOD PRESSURE KIT) DEVI 1 dose pack by Does not apply route daily    Historical Provider, MD   Misc. Devices (GOJJI WEIGHT SCALE) MISC 1 Dose by Does not apply route daily    Historical Provider, MD        Review of Systems: History obtained from the patient  Constitutional: negative for weight loss, fever, night sweats  HEENT: negative for hearing loss, earache, congestion, snoring, sore throat  CV: negative for chest pain, palpitations, edema  Resp: negative for cough, shortness of breath, wheezing  Breast: negative for breast lumps, nipple discharge, galactorrhea  GI: negative for change in bowel habits, abdominal pain, black or bloody stools  GU: negative for frequency, dysuria, hematuria, vaginal discharge  MSK: negative for back pain, joint pain, muscle pain  Skin: negative for itching, rash, hives  Neuro: negative for dizziness, headache, confusion, weakness  Psych: negative for anxiety, depression, change in mood  Heme/lymph: negative for bleeding, bruising, pallor    Objective:  BP 124/80   Wt 156 lb (70.8 kg)     Physical Exam:     Constitutional  Appearance: well-nourished, well developed, alert, in no acute distress    HENT  Head  Face: appears normal  Eyes: appear normal  Ears: normal  Mouth: normal  Lips: no lesions      Chest  Respiratory Effort: breathing unlabored      Gastrointestinal  Abdominal Examination: abdomen non-tender to palpation, normal bowel sounds, no masses present  Liver and spleen: no hepatomegaly present, spleen not palpable  Hernias: no hernias  identified    Genitourinary  Deferred     Skin  General Inspection: no rash, no lesions identified    Neurologic/Psychiatric  Mental Status:  Orientation: grossly oriented to person, place and time  Mood and Affect: mood normal, affect appropriate      Assessment and Plan:   Lynn Perry is a 26 y.o. G1P0 at [redacted]w[redacted]d  New OB  Hypothyroidism   -on 75 mcg of Synthroid  Late transfer of care at 30 weeks    Intrauterine pregnancy with issues addressed in problem list.  We discussed genetic testing screening options for the pregnancy and offered NIPT and the patient previously had it completed.  We discussed carrier screening as per ACOG guidelines for CF, SMA, DMD, and Fragile X syndrome and the patient declines carrier screening.    Course of pregnancy discussed including visit schedule, routine U/S, glucola testing, etc.  Avoid alcoholic beverages and illicit/recreational drugs use.  Take prenatal vitamins or folic acid daily.  Hospital and practice style discussed with coverage system.  Discussed nutrition, toxoplasmosis precautions, sexual activity, exercise, need for influenza vaccine, environmental and work hazards, travel advice, screen for domestic violence, need for seat belts.  Discussed seafood, unpasteurized dairy products, deli meat, artificial sweeteners, and caffeine.  Discussed current prescription drug use. Given medication list.  Discussed the use of over the counter medications and chemicals.  Route of delivery discussed, including risks, benefits  Handouts given to pt.      Patient will return to office   Return in about 2 weeks (around 11/13/2021) for ROB + Growth [place]<D.      Devine Klingel L. SHervey Ard MD, FCherlynn June

## 2021-11-15 ENCOUNTER — Encounter

## 2021-11-15 ENCOUNTER — Ambulatory Visit: Admit: 2021-11-15 | Payer: MEDICAID

## 2021-11-15 ENCOUNTER — Ambulatory Visit: Admit: 2021-11-15 | Discharge: 2021-12-01 | Payer: MEDICAID | Attending: Obstetrics & Gynecology

## 2021-11-15 DIAGNOSIS — Z3480 Encounter for supervision of other normal pregnancy, unspecified trimester: Secondary | ICD-10-CM

## 2021-11-15 DIAGNOSIS — O26843 Uterine size-date discrepancy, third trimester: Secondary | ICD-10-CM

## 2021-11-15 NOTE — Progress Notes (Signed)
Patient here for return OB visit at [redacted]w[redacted]d. She reports no concerns.  She reports good fetal movement.   She denies vaginal bleeding, loss of fluid, abnormal vaginal discharge, UTI symptoms, cramping, or contractions.   Discussed FGR in detail. Referral to MFM. Stat referral placed.     [place] today demonstrates the following -   A SINGLE VERTEX [redacted]W[redacted]D IUP IS SEEN. FETAL CARDIAC MOTION OBSERVED.  LIMITED ANATOMY WAS VISUALIZED AND APPEARS WNL.  EFW= 2 LB 12 OZ ( 7 %) AC APPEARS LESS THAN 10%.  BPP=8/8  AFI= 11.7 CM  PLACENTA APPEARS WITHIN NORMAL LIMITS.      BP 110/72   Wt 157 lb (71.2 kg)             Patient Active Problem List    Diagnosis Date Noted    [redacted] weeks gestation of pregnancy 10/06/2021     Priority: High     OB History   No obstetric history on file.        Primary Provider: Hermelinda Dellen - SAB x1 January 2019  [facility] by: [place] 05/14/21 with EDD of 01/08/22  Social:   IOB labs: B POSITIVE/RI/HIV neg/RPR neg/HEP C neg/Hep B neg/Hgb 13.4/Rubella NON-IMMUNE  - gc/c negative.   - TSH 1.660     Genetic Screening:Panoroma LOW RISK - FEMALE, [place] NEGATIVE 4/4.  Anatomy: 07/13/2021, Normal FAS  3rd TM labs: GTT 10/05/2021  Hgb___ Plts___  Flu:__ TDAP: Admin 10/05/2021  Rhogam: B POSITIVE  GBS:  Postpartum Care: Breast/Bottle Circ      Problem List:  1. FGR 11/15/21  - AC <1%, HC <1%, FL 1%. EFW 7%.   - Referred to [facility] MFM STAT           Return for one week.    Lynn Jhaveri L. Lambert Mody, MD

## 2021-11-19 ENCOUNTER — Inpatient Hospital Stay
Admit: 2021-11-19 | Discharge: 2021-11-19 | Disposition: A | Discharge status: Home / Self Care | Payer: BLUE CROSS/BLUE SHIELD | Attending: Student in an Organized Health Care Education/Training Program

## 2021-11-19 DIAGNOSIS — O99353 Diseases of the nervous system complicating pregnancy, third trimester: Secondary | ICD-10-CM

## 2021-11-19 DIAGNOSIS — G510 Bell's palsy: Secondary | ICD-10-CM

## 2021-11-19 MED ORDER — VALACYCLOVIR HCL 1 G PO TABS
1 g | ORAL_TABLET | Freq: Three times a day (TID) | ORAL | 0 refills | Status: AC
Start: 2021-11-19 — End: 2021-11-26

## 2021-11-19 MED ORDER — PREDNISONE 20 MG PO TABS
20 MG | ORAL_TABLET | Freq: Every day | ORAL | 0 refills | Status: AC
Start: 2021-11-19 — End: 2021-11-26

## 2021-11-19 NOTE — ED Triage Notes (Signed)
Arrives ambulatory for c/o left sided facial pain since Friday night and today noticed eye lid started drooping.      +32/33 wks.

## 2021-11-19 NOTE — ED Provider Notes (Signed)
[facility] EMERGENCY DEP  EMERGENCY DEPARTMENT ENCOUNTER      Pt Name: Lynn Perry  MRN: [id number]  [name] 1995/04/01  Date of evaluation: 11/19/2021  Provider: Andrey Farmer, MD    CHIEF COMPLAINT       Chief Complaint   Patient presents with    Facial Pain         HISTORY OF PRESENT ILLNESS   (Location/Symptom, Timing/Onset, Context/Setting, Quality, Duration, Modifying Factors, Severity)  Note limiting factors.   Patient is a 26 year old female present emergency department with right-sided facial pain.  Patient states that when she woke up Friday she was having left-sided facial pain and today was noticing that she was having difficulty closing her left eye.  Patient denies any ear pain loss of hearing visual changes changes in her speech upper or lower extremity weakness numbness or tingling.  Patient also denies any fevers or rashes.  Patient is currently [redacted] weeks gestational age denies any abdominal pain pelvic pain or contractions.  Patient is otherwise healthy.          Review of External Medical Records:     Nursing Notes were reviewed.    REVIEW OF SYSTEMS    (2-9 systems for level 4, 10 or more for level 5)     Review of Systems    Except as noted above the remainder of the review of systems was reviewed and negative.       PAST MEDICAL HISTORY     Past Medical History:   Diagnosis Date    Hypothyroidism     Major depressive disorder     Psychogenic nonepileptic seizure     Rubella non-immune status, antepartum          SURGICAL HISTORY       Past Surgical History:   Procedure Laterality Date    LYMPH NODE DISSECTION      TONSILLECTOMY Bilateral 2006         CURRENT MEDICATIONS       Previous Medications    BLOOD PRESSURE MONITORING (BLOOD PRESSURE KIT) DEVI    1 dose pack by Does not apply route daily    CYCLOBENZAPRINE (FLEXERIL) 5 MG TABLET    Take 1 tablet by mouth 2 times daily as needed for Muscle spasms    FAMOTIDINE (PEPCID) 20 MG TABLET    Take 1 tablet by mouth 2 times daily as needed     LEVOTHYROXINE (SYNTHROID) 75 MCG TABLET    Take 1 tablet by mouth daily    MISC. DEVICES (GOJJI WEIGHT SCALE) MISC    1 Dose by Does not apply route daily    PRENATAL VIT-FE FUMARATE-FA (PRENATAL VITAMIN) 27-1 MG TABS TABLET    Take 1 tablet by mouth daily       ALLERGIES     Contrast [iodides]    FAMILY HISTORY       Family History   Problem Relation Age of Onset    Lupus Mother         +Diverticulitis    Hypertension Father           SOCIAL HISTORY       Social History     Socioeconomic History    Marital status: Single     Spouse name: None    Number of children: None    Years of education: None    Highest education level: None   Tobacco Use    Smoking status: Never    Smokeless tobacco: Never  Vaping Use    Vaping Use: Never used   Substance and Sexual Activity    Alcohol use: Not Currently    Sexual activity: Yes     Partners: Male     Birth control/protection: None           PHYSICAL EXAM    (up to 7 for level 4, 8 or more for level 5)     ED Triage Vitals [11/19/21 1129]   BP Temp Temp Source Pulse Respirations SpO2 Height Weight   (!) 138/97 98.5 F (36.9 C) Oral 83 16 98 % -- --       There is no height or weight on file to calculate BMI.    Physical Exam  Vitals and nursing note reviewed.   Constitutional:       Appearance: Normal appearance.   HENT:      Head: Normocephalic and atraumatic.      Nose: Nose normal.   Eyes:      General: No visual field deficit.     Extraocular Movements: Extraocular movements intact.      Pupils: Pupils are equal, round, and reactive to light.   Cardiovascular:      Rate and Rhythm: Normal rate and regular rhythm.      Pulses: Normal pulses.      Heart sounds: Normal heart sounds.   Pulmonary:      Effort: Pulmonary effort is normal.      Breath sounds: Normal breath sounds.   Abdominal:      General: Bowel sounds are normal.      Palpations: Abdomen is soft.   Musculoskeletal:         General: Normal range of motion.      Cervical back: Normal range of motion and neck  supple.   Skin:     General: Skin is warm and dry.   Neurological:      General: No focal deficit present.      Mental Status: She is alert and oriented to person, place, and time. Mental status is at baseline.      Cranial Nerves: Cranial nerve deficit present. No dysarthria.      Comments: Ptosis of the left eye patient with inability to raise her left eyebrow increase sensation in the V1, V2, V3 dermatome of the left side.   Psychiatric:         Mood and Affect: Mood normal.         Behavior: Behavior normal.       DIAGNOSTIC RESULTS     EKG: All EKG's are interpreted by the Emergency Department Physician who either signs or Co-signs this chart in the absence of a cardiologist.        RADIOLOGY:   Non-plain film images such as CT, Ultrasound and MRI are read by the radiologist. Plain radiographic images are visualized and preliminarily interpreted by the emergency physician with the below findings:        Interpretation per the Radiologist below, if available at the time of this note:    No orders to display        LABS:  Labs Reviewed - No data to display    All other labs were within normal range or not returned as of this dictation.    EMERGENCY DEPARTMENT COURSE and DIFFERENTIAL DIAGNOSIS/MDM:   Vitals:    Vitals:    11/19/21 1129   BP: (!) 138/97   Pulse: 83   Resp: 16   Temp:  98.5 F (36.9 C)   TempSrc: Oral   SpO2: 98%           Medical Decision Making  Bell's palsy.  26 year old female present emergency department with classic Bell's palsy presentation no other neurologic symptoms physical exam consistent with Bell's palsy.  Patient will require 7-day course of prednisone 60 mg daily as well as antivirals patient will follow-up with OB/GYN this week.    Risk  Prescription drug management.            REASSESSMENT            CONSULTS:  None    PROCEDURES:  Unless otherwise noted below, none     Procedures      FINAL IMPRESSION      1. Bell's palsy          DISPOSITION/PLAN   DISPOSITION Decision To  Discharge 11/19/2021 11:52:46 AM      PATIENT REFERRED TO:  [facility] EMERGENCY [place]  [phone]    If symptoms worsen      DISCHARGE MEDICATIONS:  New Prescriptions    PREDNISONE (DELTASONE) 20 MG TABLET    Take 3 tablets by mouth daily for 7 days    VALACYCLOVIR (VALTREX) 1 G TABLET    Take 1 tablet by mouth 3 times daily for 7 days         (Please note that portions of this note were completed with a voice recognition program.  Efforts were made to edit the dictations but occasionally words are mis-transcribed.)    Andrey Farmer, MD (electronically signed)  Emergency Attending Physician / Physician Assistant / Nurse Practitioner             Andrey Farmer, MD  11/19/21 [phone]

## 2021-11-19 NOTE — ED Notes (Signed)
Patient stated verbal understanding of dc paperwork and ambulated from ed without incident.      Sadie Haber, RN  11/19/21 1254

## 2021-11-23 ENCOUNTER — Ambulatory Visit: Admit: 2021-11-23 | Discharge: 2021-12-01 | Payer: MEDICAID | Attending: Obstetrics & Gynecology

## 2021-11-23 DIAGNOSIS — Z3A28 28 weeks gestation of pregnancy: Secondary | ICD-10-CM

## 2021-11-23 NOTE — Progress Notes (Signed)
Patient here for return OB visit at [redacted]w[redacted]d. She reports feeling well overall. No current concerns.  Seen in ED 9/03 for symptoms of Bell's Palsy. Given 7 day course of Prednisone 60 mg and antivirals. Feeling well today.   She reports good fetal movement.   She denies vaginal bleeding, loss of fluid, abnormal vaginal discharge, UTI symptoms, cramping, or contractions.       BP 128/80   Wt 160 lb (72.6 kg)       NST: indication IUGR  Baseline 125 moderate variability acceleration present decelerations absent       Patient Active Problem List    Diagnosis Date Noted    [redacted] weeks gestation of pregnancy 10/06/2021     Priority: High     Primary Provider: Hermelinda Dellen - SAB x1 January 2019  [facility] by: [place] 05/14/21 with EDD of 01/08/22  Social: Moved at 28 week from [place]. Husband is a Social research officer, government   IOB labs: B POSITIVE/RI/HIV neg/RPR neg/HEP C neg/Hep B neg/Hgb 13.4/Rubella NON-IMMUNE  - gc/c negative.   - TSH 1.660  Genetic Screening:Panoroma LOW RISK - FEMALE, [place] NEGATIVE 4/4.  Anatomy: 07/13/2021, Normal FAS  3rd TM labs: GTT 10/05/2021  Flu:__ TDAP: Admin 10/05/2021  Rhogam: B POSITIVE  GBS:  Postpartum Care: Breast. Circ      Problem List:  1.Severe FGR EFW <1%tile with elevated UA dopplers  -following with MFM weekly   2. Hypothyroidism   -on of synthroid  3.Bells Palsy in pregnancy  -resolved  -on prednisone and valtrex              Return in about 1 week (around 11/30/2021).    Taneasha Fuqua L. Lambert Mody, MD

## 2021-11-24 ENCOUNTER — Telehealth: Payer: Self-pay

## 2021-11-24 NOTE — Telephone Encounter (Signed)
Call received from patient requesting records from prenatal visit that includes glucose test, thyroid level, and blood count results. Pt gives new call back number [phone].

## 2021-11-25 ENCOUNTER — Inpatient Hospital Stay
Admission: EM | Admit: 2021-11-25 | Discharge: 2021-11-30 | Disposition: A | Discharge status: Home / Self Care | Payer: BLUE CROSS/BLUE SHIELD | Source: Other Acute Inpatient Hospital | Admitting: Obstetrics & Gynecology

## 2021-11-25 DIAGNOSIS — O368390 Maternal care for abnormalities of the fetal heart rate or rhythm, unspecified trimester, not applicable or unspecified: Secondary | ICD-10-CM

## 2021-11-25 DIAGNOSIS — Z98891 History of uterine scar from previous surgery: Secondary | ICD-10-CM

## 2021-11-25 DIAGNOSIS — O1414 Severe pre-eclampsia complicating childbirth: Principal | ICD-10-CM

## 2021-11-25 LAB — TYPE AND SCREEN
ABO/Rh: B POS
Antibody Screen: NEGATIVE

## 2021-11-25 LAB — COMPREHENSIVE METABOLIC PANEL
ALT: 19 U/L (ref 12–78)
AST: 16 U/L (ref 15–37)
Albumin/Globulin Ratio: 0.9 — ABNORMAL LOW (ref 1.1–2.2)
Albumin: 3 g/dL — ABNORMAL LOW (ref 3.5–5.0)
Alk Phosphatase: 124 U/L — ABNORMAL HIGH (ref 45–117)
Anion Gap: 4 mmol/L — ABNORMAL LOW (ref 5–15)
BUN: 9 MG/DL (ref 6–20)
Bun/Cre Ratio: 13 (ref 12–20)
CO2: 26 mmol/L (ref 21–32)
Calcium: 9.2 MG/DL (ref 8.5–10.1)
Chloride: 109 mmol/L — ABNORMAL HIGH (ref 97–108)
Creatinine: 0.67 MG/DL (ref 0.55–1.02)
Est, Glom Filt Rate: >60 mL/min/{1.73_m2} (ref >60)
Globulin: 3.4 g/dL (ref 2.0–4.0)
Glucose: 68 mg/dL (ref 65–100)
Potassium: 4.4 mmol/L (ref 3.5–5.1)
Sodium: 139 mmol/L (ref 136–145)
Total Bilirubin: 0.3 MG/DL (ref 0.2–1.0)
Total Protein: 6.4 g/dL (ref 6.4–8.2)

## 2021-11-25 LAB — URINALYSIS WITH REFLEX TO CULTURE
BACTERIA, URINE: NEGATIVE /hpf
Bilirubin Urine: NEGATIVE
Blood, Urine: NEGATIVE
Glucose, UA: NEGATIVE mg/dL
Ketones, Urine: NEGATIVE mg/dL
Leukocyte Esterase, Urine: NEGATIVE
Nitrite, Urine: NEGATIVE
Protein, UA: NEGATIVE mg/dL
Specific Gravity, UA: 1.01 (ref 1.003–1.030)
Urobilinogen, Urine: 0.2 EU/dL (ref 0.2–1.0)
pH, Urine: 7 (ref 5.0–8.0)

## 2021-11-25 LAB — CBC
Hematocrit: 35.7 % (ref 35.0–47.0)
Hemoglobin: 11.8 g/dL (ref 11.5–16.0)
MCH: 30.1 PG (ref 26.0–34.0)
MCHC: 33.1 g/dL (ref 30.0–36.5)
MCV: 91.1 FL (ref 80.0–99.0)
MPV: 12.8 FL (ref 8.9–12.9)
Nucleated RBCs: 0 PER 100 WBC
Platelets: 213 10*3/uL (ref 150–400)
RBC: 3.92 M/uL (ref 3.80–5.20)
RDW: 13.2 % (ref 11.5–14.5)
WBC: 11.7 10*3/uL — ABNORMAL HIGH (ref 3.6–11.0)
nRBC: 0 10*3/uL (ref 0.00–0.01)

## 2021-11-25 LAB — PROTEIN / CREATININE RATIO, URINE
Creatinine, Ur: 58.9 mg/dL
PROTEIN/CREAT RATIO URINE RAN: 0.3
Protein, Urine, Random: 15 mg/dL — ABNORMAL HIGH (ref 0.0–11.9)

## 2021-11-25 LAB — KOH (SKIN,HAIR,NAILS): KOH Prep: NONE SEEN

## 2021-11-25 LAB — WET PREP, GENITAL: Trich, Wet Prep: NONE SEEN

## 2021-11-25 MED ORDER — LEVOTHYROXINE SODIUM 75 MCG PO TABS
75 MCG | Freq: Every day | ORAL | Status: AC
Start: 2021-11-25 — End: ?
  Administered 2021-11-25 – 2021-11-30 (×6): 75 ug via ORAL

## 2021-11-25 MED ORDER — LACTATED RINGERS IV SOLN
INTRAVENOUS | Status: AC
Start: 2021-11-25 — End: 2021-11-26
  Administered 2021-11-25 (×2): via INTRAVENOUS

## 2021-11-25 MED ORDER — VALACYCLOVIR HCL 500 MG PO TABS
500 MG | Freq: Three times a day (TID) | ORAL | Status: AC
Start: 2021-11-25 — End: 2021-11-28
  Administered 2021-11-25 – 2021-11-28 (×7): 1000 mg via ORAL

## 2021-11-25 MED ORDER — LACTATED RINGERS IV BOLUS
Freq: Once | INTRAVENOUS | Status: AC
Start: 2021-11-25 — End: ?

## 2021-11-25 MED ORDER — ACETAMINOPHEN 325 MG PO TABS
325 MG | ORAL | Status: AC
Start: 2021-11-25 — End: 2021-11-25

## 2021-11-25 MED ORDER — ACETAMINOPHEN 325 MG PO TABS
325 | ORAL | Status: DC | PRN
Start: 2021-11-25 — End: 2021-11-30
  Administered 2021-11-25 (×2): 650 mg via ORAL

## 2021-11-25 MED ORDER — BETAMETHASONE SOD PHOS & ACET 6 (3-3) MG/ML IJ SUSP
63-3 (3-3) MG/ML | INTRAMUSCULAR | Status: AC
Start: 2021-11-25 — End: 2021-11-27
  Administered 2021-11-25 – 2021-11-26 (×2): 12 mg via INTRAMUSCULAR

## 2021-11-25 MED FILL — VALTREX 500 MG PO TABS: 500 MG | ORAL | Qty: 2

## 2021-11-25 MED FILL — SYNTHROID 50 MCG PO TABS: 50 MCG | ORAL | Qty: 2

## 2021-11-25 MED FILL — ACETAMINOPHEN 325 MG PO TABS: 325 MG | ORAL | Qty: 2

## 2021-11-25 MED FILL — CELESTONE SOLUSPAN 6 (3-3) MG/ML IJ SUSP: 6 (3-3) MG/ML | INTRAMUSCULAR | Qty: 2

## 2021-11-25 MED FILL — LACTATED RINGERS IV SOLN: INTRAVENOUS | Qty: 1000

## 2021-11-25 MED FILL — LACTATED RINGERS IV SOLN: INTRAVENOUS | Qty: 500

## 2021-11-25 NOTE — Progress Notes (Addendum)
0730 Bedside shift change report given to M. Ladona Ridgel, RN (oncoming nurse) by Vergie Living, RN (offgoing nurse). Report included the following information SBAR, MAR, I/O.     1100 Dr. Sandria Manly in room talking about plan of care with patient. Okay with patient to eat and get hospital bed for patient.     1400 Bedside and Verbal shift change report given to Lovett Calender, RN   (oncoming nurse) by Orie Fisherman, RN (offgoing nurse). Report included the following information Nurse Handoff Report.

## 2021-11-25 NOTE — ED Notes (Signed)
Labor and Delivery Admission Note  11/25/2021    26 y.o.,  female, G1 P 0 Estimated Date of Delivery: 01/08/22 @ 33 weeks 5 days by dates and [place] presents with c/o pelvic pain which had started 2 hrs ago.  No LOF, no vaginal bleeding, no urinary symptoms  AFM  Primary Provider: Hermelinda Dellen - SAB x1 January 2019  [facility] by: [place] 05/14/21 with EDD of 01/08/22  Social: Moved at 28 week from [facility]. Husband is a Social research officer, government   IOB labs: B POSITIVE/RI/HIV neg/RPR neg/HEP C neg/Hep B neg/Hgb 13.4/Rubella NON-IMMUNE  - gc/c negative.   - TSH 1.660  Genetic Screening:Panoroma LOW RISK - FEMALE, Horizon NEGATIVE 4/4.  Anatomy: 07/13/2021, Normal FAS  3rd TM labs: GTT 10/05/2021  Flu:__ TDAP: Admin 10/05/2021  Rhogam: B POSITIVE  GBS:  Postpartum Care: Breast. Circ        Problem List:  1.Severe FGR EFW <1%tile with elevated UA dopplers  -following with MFM weekly   2. Hypothyroidism   -on of synthroid  3.Bells Palsy in pregnancy  -resolved  -on prednisone and valtrex       PNC: Blood type:             RH: pos            Rubella:             SVII serology: neg             GBS status:   Past Medical History:   Diagnosis Date    Hypothyroidism     Major depressive disorder     Psychogenic nonepileptic seizure     Rubella non-immune status, antepartum      Past Surgical History:   Procedure Laterality Date    LYMPH NODE DISSECTION      TONSILLECTOMY Bilateral 2006     OB/GYN: Dr Lambert Mody  Meds:   Current Facility-Administered Medications   Medication Dose Route Frequency    acetaminophen (TYLENOL) tablet 650 mg  650 mg Oral Q4H PRN     Allergies:   Allergies   Allergen Reactions    Contrast [Iodides]      Pertinent ROS: per HPI   Family History   Problem Relation Age of Onset    Lupus Mother         +Diverticulitis    Hypertension Father      Social History     Socioeconomic History    Marital status: Single     Spouse name: Not on file    Number of children: Not on file    Years of education: Not on file    Highest  education level: Not on file   Occupational History    Not on file   Tobacco Use    Smoking status: Never    Smokeless tobacco: Never   Vaping Use    Vaping Use: Never used   Substance and Sexual Activity    Alcohol use: Not Currently    Drug use: Not on file    Sexual activity: Yes     Partners: Male     Birth control/protection: None   Other Topics Concern    Not on file   Social History Narrative    Not on file     Social Determinants of Health     Financial Resource Strain: Not on file   Food Insecurity: Not on file   Transportation Needs: Not on file   Physical Activity:  Not on file   Stress: Not on file   Social Connections: Not on file   Intimate Partner Violence: Not on file   Housing Stability: Not on file       OBJECTIVE:  Gravid African American, female NAD  No data recorded.    BP 138/73   Pulse 54   Resp 16     Labs:  No results found for: "WBC", "HGB", "HCT", "PLT", "BUN", "CREA"    Exam:  HEENT:  normal   Lungs:  clear  Cor:  RRR  Abdomen:  Fundal height 32 cm                    Soft between UC                     Fetal heart rate tracing:  130  Contraction pattern: every 15-20 mins  Cervix:  closed/posterior  Fluid:  Intact  Impression:  IUP at 33 weeks 5 days with abdominal pain and preterm contractions  IVF  Wet prep, U/A       Rulon Eisenmenger, MD

## 2021-11-25 NOTE — Progress Notes (Signed)
Labor Progress Note  Patient seen, fetal heart rate and contraction pattern evaluated, patient examined.  BP 138/73   Pulse 54   Resp 16   Resting comfortably  Denies feeling additional Uc's    Physical Exam:  Cervical Exam:  Deferred  Membranes:  Intact  Uterine Activity: None  Fetal Heart Rate: Baseline: 125 per minute  Variability: moderate  Accelerations: yes  Decelerations: Random, Spont decels not associated with UC's    Assessment/Plan:  Discussed recommendation for Betamethasone course if < [redacted] weeks GA and delivery is suspected within 7 days  Pt and her mother agree to proceed with Betamethasone now and repeat in 24 hrs  Close observation and Cont EFM now  Dr. Sandria Manly to follow this am  Rosalva Ferron. Ladona Ridgel, WHNP/CNM

## 2021-11-25 NOTE — Progress Notes (Addendum)
0100~  Patient arrived from home after onset of mild low abdominal cramping 2 hours ago.  In the past hour the pains have started to become stronger and have begun radiating towards her back.  Patient denies bleeding or LOF.  Was seen by MFM earlier on 9/8 for growth scans.  Here to rule out PTL.    0107~  Call placed to Dr Vernell Leep regarding patient's arrival and status.    0110~  Dr Vernell Leep in room to assess patient. SVE, wet prep obtained.  Orders received.    0223~  Call placed to Dr Vernell Leep regarding contractions, orders for IV received    0300~  Lab results received, and called to Dr Vernell Leep along with update on FHT.  Patient to be transferred to L&D for prolonged monitoring, D Ladona Ridgel CNM to assume care of patient.    0640~ D Taylor CNM updated on patient's status.  Aware of continued late decelerations, but decreased contractions. Dr Sandria Manly to see patient this morning.  Orders received.    0710~  Anabel Halon CNM in to see patient    0745~  Bedside and Verbal shift change report given to Orie Fisherman RN (oncoming nurse) by Vergie Living RN (offgoing nurse). Report included the following information Nurse Handoff Report.

## 2021-11-25 NOTE — Progress Notes (Signed)
Ante Partum Progress Note    Lynn Perry  [redacted]w[redacted]d   Assessment and Plan: 350w5d Threatened preterm labor: improved since admission, continue to follow. Cervix Closed on admission. UA neg, wet prep neg.   - BMZ course in progress, first dose given at 0730am  - collect GBS  - continue IV fluid hydration for now    Severe IUGR: <3%ile with elevated dopplers. [facility] plan delivery at [id number] or sooner if fetal status changes  - Continue hospitalization with hospitalized bedrest  - Complete antenatal steroid course  - Maternal fetal medicine consultation Monday.    Mild range Bps: cont to follow Bps and check HTN labs    Hypothyroidism: cont home synthroid    Orders/Charges: Medium    S:  Patient states she has no new complaints. She is still feeling contractions every few minutes in her back, not as severe as upon presentation.  Random FHR decelerations (about once an hour earlier this morning) have resolved.  She is understanding of the plan of hospitalization for now, has had a lot of changes in the past few weeks. [place] on [place].     Vitals:  BP 133/83   Pulse 51   Temp 99.1 F (37.3 C) (Oral)   Resp 16   Temp (24hrs), Avg:99.1 F (37.3 C), Min:99.1 F (37.3 C), Max:99.1 F (37.3 C)      Last 24hr Input/Output:  No intake or output data in the 24 hours ending 11/25/21 1151     Non stress test:    Non-stress test  Indication: threatened preterm labor, IUGR  FHR: baseline 150, moderate variability, accelerations present, no decelerations  Toco: contractions noted irregularly   Duration monitored: >30 minutes  Interpretation: reactive and reassuring, Category 1        No data found. No data found.       Exam:  Patient without distress.     Abdomen, fundus soft non-tender     Extremities, no redness or tenderness               Additional Exam: Deferred    Labs:     Lab Results   Component Value Date/Time     WBC 11.7 11/25/2021 08:13 AM    HGB 11.8 11/25/2021 08:13 AM    HCT 35.7 11/25/2021 08:13 AM    PLT 213 11/25/2021 08:13 AM       Recent Results (from the past 24 hour(s))   Wet prep, genital    Collection Time: 11/25/21  1:30 AM    Specimen: Miscellaneous sample   Result Value Ref Range    Clue Cells, Wet Prep CLUE CELLS PRESENT      Trich, Wet Prep NO TRICHOMONAS SEEN     KOH (Skin,Hair,Nails)    Collection Time: 11/25/21  1:30 AM    Specimen: Vaginal swab   Result Value Ref Range    Special Requests NO SPECIAL REQUESTS      KOH Prep NO FUNGAL ELEMENTS SEEN     Urinalysis with Reflex to Culture    Collection Time: 11/25/21  1:30 AM    Specimen: Urine   Result Value Ref Range    Color, UA YELLOW/STRAW      Appearance CLEAR CLEAR      Specific Gravity, UA 1.010 1.003 - 1.030      pH, Urine 7.0 5.0 -  8.0      Protein, UA Negative NEG mg/dL    Glucose, UA Negative NEG mg/dL    Ketones, Urine Negative NEG mg/dL    Bilirubin Urine Negative NEG      Blood, Urine Negative NEG      Urobilinogen, Urine 0.2 0.2 - 1.0 EU/dL    Nitrite, Urine Negative NEG      Leukocyte Esterase, Urine Negative NEG      WBC, UA 0-4 0 - 4 /hpf    RBC, UA 0-5 0 - 5 /hpf    Epithelial Cells UA FEW FEW /lpf    BACTERIA, URINE Negative NEG /hpf    Urine Culture if Indicated CULTURE NOT INDICATED BY UA RESULT CNI     Protein / creatinine ratio, urine    Collection Time: 11/25/21  1:30 AM   Result Value Ref Range    Protein, Urine, Random 15 (H) 0.0 - 11.9 mg/dL    Creatinine, Ur 58.90 mg/dL    PROTEIN/CREAT RATIO URINE RAN 0.3     CBC    Collection Time: 11/25/21  8:13 AM   Result Value Ref Range    WBC 11.7 (H) 3.6 - 11.0 K/uL    RBC 3.92 3.80 - 5.20 M/uL    Hemoglobin 11.8 11.5 - 16.0 g/dL    Hematocrit 35.7 35.0 - 47.0 %    MCV 91.1 80.0 - 99.0 FL    MCH 30.1 26.0 - 34.0 PG    MCHC 33.1 30.0 - 36.5 g/dL    RDW 13.2 11.5 - 14.5 %    Platelets 213 150 - 400 K/uL    MPV 12.8 8.9 - 12.9 FL    Nucleated RBCs 0.0 0 PER 100 WBC    nRBC 0.00 0.00 - 0.01  K/uL   TYPE AND SCREEN    Collection Time: 11/25/21  8:13 AM   Result Value Ref Range    Crossmatch expiration date 11/28/2021,2359     ABO/Rh B POSITIVE     Antibody Screen NEG    Comprehensive Metabolic Panel    Collection Time: 11/25/21 10:59 AM   Result Value Ref Range    Sodium 139 136 - 145 mmol/L    Potassium 4.4 3.5 - 5.1 mmol/L    Chloride 109 (H) 97 - 108 mmol/L    CO2 26 21 - 32 mmol/L    Anion Gap 4 (L) 5 - 15 mmol/L    Glucose 68 65 - 100 mg/dL    BUN 9 6 - 20 MG/DL    Creatinine 0.67 0.55 - 1.02 MG/DL    Bun/Cre Ratio 13 12 - 20      Est, Glom Filt Rate >60 >60 ml/min/1.37m    Calcium 9.2 8.5 - 10.1 MG/DL    Total Bilirubin 0.3 0.2 - 1.0 MG/DL    ALT 19 12 - 78 U/L    AST 16 15 - 37 U/L    Alk Phosphatase 124 (H) 45 - 117 U/L    Total Protein 6.4 6.4 - 8.2 g/dL    Albumin 3.0 (L) 3.5 - 5.0 g/dL    Globulin 3.4 2.0 - 4.0 g/dL    Albumin/Globulin Ratio 0.9 (L) 1.1 - 2.2

## 2021-11-26 LAB — CULTURE, URINE: Culture: NO GROWTH

## 2021-11-26 MED ORDER — CYCLOBENZAPRINE HCL 10 MG PO TABS
10 MG | Freq: Three times a day (TID) | ORAL | Status: AC | PRN
Start: 2021-11-26 — End: ?
  Administered 2021-11-26: 03:00:00 10 mg via ORAL

## 2021-11-26 MED ORDER — ONDANSETRON HCL 4 MG/2ML IJ SOLN
4 MG/2ML | INTRAMUSCULAR | Status: AC
Start: 2021-11-26 — End: 2021-11-25
  Administered 2021-11-26: 02:00:00 4 via INTRAVENOUS

## 2021-11-26 MED ORDER — ONDANSETRON HCL 4 MG/2ML IJ SOLN
4 MG/2ML | Freq: Four times a day (QID) | INTRAMUSCULAR | Status: AC | PRN
Start: 2021-11-26 — End: 2021-11-27
  Administered 2021-11-27: 10:00:00 4 mg via INTRAVENOUS

## 2021-11-26 MED ORDER — ALUMINUM & MAGNESIUM HYDROXIDE 200-200 MG/5ML PO SUSP
200-200 MG/5ML | Freq: Four times a day (QID) | ORAL | Status: AC | PRN
Start: 2021-11-26 — End: ?

## 2021-11-26 MED ORDER — FAMOTIDINE 20 MG PO TABS
20 MG | Freq: Two times a day (BID) | ORAL | Status: AC | PRN
Start: 2021-11-26 — End: ?

## 2021-11-26 MED ORDER — EPHEDRINE SULFATE (PRESSORS) 50 MG/ML IV SOLN
50 MG/ML | INTRAVENOUS | Status: AC
Start: 2021-11-26 — End: 2021-11-26

## 2021-11-26 MED FILL — VALTREX 500 MG PO TABS: 500 MG | ORAL | Qty: 2

## 2021-11-26 MED FILL — CYCLOBENZAPRINE HCL 10 MG PO TABS: 10 MG | ORAL | Qty: 1

## 2021-11-26 MED FILL — ONDANSETRON HCL 4 MG/2ML IJ SOLN: 4 MG/2ML | INTRAMUSCULAR | Qty: 2

## 2021-11-26 MED FILL — VALACYCLOVIR HCL 500 MG PO TABS: 500 MG | ORAL | Qty: 2

## 2021-11-26 MED FILL — EPHEDRINE SULFATE (PRESSORS) 50 MG/ML IV SOLN: 50 MG/ML | INTRAVENOUS | Qty: 1

## 2021-11-26 MED FILL — CELESTONE SOLUSPAN 6 (3-3) MG/ML IJ SUSP: 6 (3-3) MG/ML | INTRAMUSCULAR | Qty: 2

## 2021-11-26 MED FILL — SYNTHROID 50 MCG PO TABS: 50 MCG | ORAL | Qty: 2

## 2021-11-26 NOTE — Progress Notes (Addendum)
2205~  Patient nauseated and vomiting, D Ladona Ridgel CNM aware.  Orders received.    2240~  Patient up to shower.    2300~  Back to bed.    0538~  Patient up to bathroom.    0750~  Bedside and Verbal shift change report given to Lovett Calender RN (oncoming nurse) by Vergie Living RN (offgoing nurse). Report included the following information Nurse Handoff Report.

## 2021-11-26 NOTE — Progress Notes (Signed)
Consult received from Dr. Sandria Manly earlier today, discussed by phone.  Mom asleep when I came by to consult this afternoon, NICU team will return.  Leverne Humbles, MD  11/26/21@1603 

## 2021-11-26 NOTE — Consults (Signed)
[facility]  Prenatal Consult  [place], [place] MRN: [id number] [facility]: [id number]  Note Date: 11/26/2021  Place of Service: Antenatal InPatientRequested By: Kristeen Mans  Reason for Consultation: Growth restricted infant 64 &6/7 wks, expected preterm delivery    Maternal History  DOB: [birth year]Mother's Age: 24Mother's Blood Type: B PosG:  1  Syphilis: RPR NegativeHIV: NegativeRubella:  Non-ImmuneGBS: PendingHBsAg: NegativeHep C: NegativeGC: NegativeChlamydia: Negative  Prenatal Care: YesEDC OB: 61/60/7371  Pregnancy Complications  Poor fetal growth, 3rd trimester, single/unspec fetus (G62.6948)  Hypothyroidism, 3rd trimester (O99.283)  Premature onset of labor, w/o delivery, 3rd trimester (O60.03)Comment: threatened  Maternal Steroids Yes  Last Dose Date: 11/26/2021 at 08:19:00Next Recent Dose Date: 11/25/2021 at 07:55:00  Maternal Medications: Yes  Levothyroxine    ValacyclovirComment: recent Bell's Palsy dx  Pregnancy Comment  Presented at 96 &5/7 with contractions, no cervical changes. Known severely growth restricted fetus, last MFM visit through [place] and EFW not available at time of consult though report < 1%, also known elevated dopplers. Having some decels on monitor with recovery.  Present Plan  Continue inpatient admission, to see MFM on 9/11. Certainly expect delivery by 37 wks with IUGR but high risk for sooner given observed decels.    Recommendations  I have reviewed the mother`s medical chart and spoken with the obstetrician requesting this consult.  I met with the mother and friend.  The baby will be a boy.    I reviewed the most common problems encountered for babies born at 100 weeks gestation, stressing that all babies are different, and the chances of complications tend to lessen at advancing gestational ages. While most any organ system can have a complication, the absolute risk of severe complications is very low and the opportunity for a good outcome is high.  Among  infants admitted to the NICU, survival is approximately 99% and survival without severe complications is very high.    I discussed the delivery room management, and explained the personnel that will be present at delivery. I reviewed the plan for delayed cord clamping (if appropriate) and thermoregulation support.  Some infants at this age need respiratory support due to lung immaturity, commonly CPAP or a nasal cannula, though some need mechanical ventilation. We also discussed the use of artificial surfactant, which may or may not be needed.  While uncommon, some infants need other measures in their resuscitation (e.g. CPR, fluid, blood).    I reviewed the typical cares given during admission to the NICU.  Some infants at this gestation need IV access and that may be an umbilical catheter(s), PICC line or peripheral IV to allow nutrition.  When sufficiently stable, gavage feedings will be started.   We discussed the critical importance of lactation to optimize outcome (improved neurodevelopment, reduced risk of NEC and infections among other things).  We discussed how we will support mother's lactation. Mother consented to donor milk.    We discussed common discharge goals, including mature thermoregulation, mature respiratory status and ability to thrive on oral feedings.  Many infants achieve this around 36-39 weeks corrected gestation, although some infants are ready sooner and some take longer.    Time was allotted for the family to ask questions, and all questions were answered to the best of my ability, given the information provided.   The family understands and agrees with the present plan.    Thank you for inviting me to speak with the family.  We remain available if the family desires further  discussion or clarification.    Total clinician time devoted to the patient on the day of this visit excluding time spent on other separately reported services was 40 minutes.  Authenticated by: Edmund Hilda, NNP    Date/Time: 11/26/2021 18:51

## 2021-11-26 NOTE — Progress Notes (Addendum)
@  1050 Bedside shift change report given to A. Lucretia Field RN (Cabin crew) by T. Roche Physiological scientist). Report included the following information Nurse Handoff Report, Index, Adult Overview, Intake/Output, MAR, and Recent Results.      Patient complaining of abdominal pain, when asked if it is any different than what she has been experiencing during her stay, she said no, it's the same. States it is intermittent and lasts for one minute. Abdomen palpates soft. Up to bathroom to see if discomfort was relieve.    @1055  After voiding patient states discomfort still feels the same. Monitors readjusted.     @1200  Patient feeling better overall    @1705 -1713 Patient sitting up on edge of bed    @1735  T. Cheek NNP at bedside for NICU consult

## 2021-11-26 NOTE — Progress Notes (Signed)
Ante Partum Progress Note    Lynn Perry  [redacted]w[redacted]d    Assessment and Plan: [redacted]w[redacted]d   Threatened preterm labor: contractions improved since admission but still present, continue to follow. Cervix Closed on admission. UA neg, wet prep neg.   - BMZ course completed 9/10 AM  - GBS pending    Severe IUGR: <3%ile with elevated dopplers. Followed by MFMs at [facility].  - plan delivery at 37 weeks or sooner if fetal status changes  - Continue hospitalization with hospitalized bedrest, continue continuous EFM for today until [place] tomorrow  - Completed antenatal steroid course 9/10  - Maternal fetal medicine consultation Monday.  - NICU consult today    Isolated mild range BP yesterday: remainder of Bps normal, cont to follow Bps, serology yesterday normal, urine p/c ratio 0.3    Hypothyroidism: cont home synthroid    Orders/Charges: Medium    S:  Patient states she has no new complaints, but relays she "feels like crap." She has remained on continuous monitoring on L&D due to occasional spontaneous FHR decelerations. The FHR can go hours without decels, but then goes through periods of 1-2 decels/hour.  She still feels occasional contractions which she is disturbed by. Also states she has heartburn and an episode of nausea. Feels FM. Her mother and father are in the room in a cot.       Vitals:  BP 126/84   Pulse 79   Temp 98.4 F (36.9 C) (Oral)   Resp 16   Temp (24hrs), Avg:98.4 F (36.9 C), Min:97.4 F (36.3 C), Max:99.2 F (37.3 C)      Last 24hr Input/Output:  No intake or output data in the 24 hours ending 11/26/21 1104     Non stress test:    Non-stress test 9/10 AM  Indication: threatened preterm labor, IUGR  FHR: baseline 130, moderate variability, accelerations present, no decelerations at present  Toco: contractions noted irregularly (few an hour)  Duration monitored: >30 minutes  Interpretation: reactive and reassuring, Category 1        No data found. No data found.       Exam:  Patient without distress.     Abdomen,  fundus soft non-tender     Extremities, no redness or tenderness               Additional Exam: Deferred    Labs:     Lab Results   Component Value Date/Time    WBC 11.7 11/25/2021 08:13 AM    HGB 11.8 11/25/2021 08:13 AM    HCT 35.7 11/25/2021 08:13 AM    PLT 213 11/25/2021 08:13 AM       No results found for this or any previous visit (from the past 24 hour(s)).

## 2021-11-26 NOTE — Progress Notes (Addendum)
4431 Report received from C Nester RN and Vergie Living RN.    1050 Bedside report given to A [employer].

## 2021-11-26 NOTE — Progress Notes (Addendum)
1930 Report received from Cherlyn Roberts, RN to assume care of patient at this time.     2355-7322 Patient sitting forward dry heaving; EFM not tracing consistently.     0700 Patient states she is hurting and tired and overwhelmed. Patient declines SVE at this time. Patient states she will wait until someone from her group comes in this morning.     0254-2706 Patient sitting forward dry heaving; EFM not tracing consistently.     0745 Report given to K. Whitaker, RN to assume care of patient at this time.

## 2021-11-27 DIAGNOSIS — O1414 Severe pre-eclampsia complicating childbirth: Secondary | ICD-10-CM

## 2021-11-27 LAB — CBC WITH AUTO DIFFERENTIAL
Absolute Immature Granulocyte: 0.1 10*3/uL — ABNORMAL HIGH (ref 0.00–0.04)
Basophils %: 0 % (ref 0–1)
Basophils Absolute: 0 10*3/uL (ref 0.0–0.1)
Eosinophils %: 0 % (ref 0–7)
Eosinophils Absolute: 0 10*3/uL (ref 0.0–0.4)
Hematocrit: 36.7 % (ref 35.0–47.0)
Hemoglobin: 12 g/dL (ref 11.5–16.0)
Immature Granulocytes: 1 % — ABNORMAL HIGH (ref 0.0–0.5)
Lymphocytes %: 20 % (ref 12–49)
Lymphocytes Absolute: 3.3 10*3/uL (ref 0.8–3.5)
MCH: 29.7 PG (ref 26.0–34.0)
MCHC: 32.7 g/dL (ref 30.0–36.5)
MCV: 90.8 FL (ref 80.0–99.0)
MPV: 12.3 FL (ref 8.9–12.9)
Monocytes %: 6 % (ref 5–13)
Monocytes Absolute: 1 10*3/uL (ref 0.0–1.0)
Neutrophils %: 73 % (ref 32–75)
Neutrophils Absolute: 12.3 10*3/uL — ABNORMAL HIGH (ref 1.8–8.0)
Nucleated RBCs: 0 PER 100 WBC
Platelets: 209 10*3/uL (ref 150–400)
RBC: 4.04 M/uL (ref 3.80–5.20)
RDW: 13.3 % (ref 11.5–14.5)
WBC: 16.8 10*3/uL — ABNORMAL HIGH (ref 3.6–11.0)
nRBC: 0 10*3/uL (ref 0.00–0.01)

## 2021-11-27 LAB — COMPREHENSIVE METABOLIC PANEL
ALT: 16 U/L (ref 12–78)
AST: 13 U/L — ABNORMAL LOW (ref 15–37)
Albumin/Globulin Ratio: 0.9 — ABNORMAL LOW (ref 1.1–2.2)
Albumin: 2.8 g/dL — ABNORMAL LOW (ref 3.5–5.0)
Alk Phosphatase: 119 U/L — ABNORMAL HIGH (ref 45–117)
Anion Gap: 5 mmol/L (ref 5–15)
BUN: 9 MG/DL (ref 6–20)
Bun/Cre Ratio: 14 (ref 12–20)
CO2: 23 mmol/L (ref 21–32)
Calcium: 8.9 MG/DL (ref 8.5–10.1)
Chloride: 114 mmol/L — ABNORMAL HIGH (ref 97–108)
Creatinine: 0.66 MG/DL (ref 0.55–1.02)
Est, Glom Filt Rate: >60 mL/min/{1.73_m2} (ref >60)
Globulin: 3.2 g/dL (ref 2.0–4.0)
Glucose: 82 mg/dL (ref 65–100)
Potassium: 3.9 mmol/L (ref 3.5–5.1)
Sodium: 142 mmol/L (ref 136–145)
Total Bilirubin: 0.3 MG/DL (ref 0.2–1.0)
Total Protein: 6 g/dL — ABNORMAL LOW (ref 6.4–8.2)

## 2021-11-27 LAB — LACTATE DEHYDROGENASE: LD: 190 U/L (ref 81–246)

## 2021-11-27 MED ORDER — LANSINOH LANOLIN EX CREA
CUTANEOUS | Status: AC | PRN
Start: 2021-11-27 — End: ?

## 2021-11-27 MED ORDER — FENTANYL CITRATE (PF) 100 MCG/2ML IJ SOLN
100 MCG/2ML | INTRAMUSCULAR | Status: AC
Start: 2021-11-27 — End: ?

## 2021-11-27 MED ORDER — SODIUM CHLORIDE 0.9 % IV SOLN (MINI-BAG)
0.9 % | Freq: Once | INTRAVENOUS | Status: AC | PRN
Start: 2021-11-27 — End: 2021-11-28

## 2021-11-27 MED ORDER — MAGNESIUM SULFATE 4 GM/100ML IV SOLN
4 GM/100ML | Freq: Once | INTRAVENOUS | Status: AC
Start: 2021-11-27 — End: 2021-11-27
  Administered 2021-11-27: 15:00:00 4000 mg via INTRAVENOUS

## 2021-11-27 MED ORDER — NORMAL SALINE FLUSH 0.9 % IV SOLN
0.9 % | Freq: Two times a day (BID) | INTRAVENOUS | Status: AC
Start: 2021-11-27 — End: ?
  Administered 2021-11-28 (×2): 10 mL via INTRAVENOUS

## 2021-11-27 MED ORDER — PHENYLEPHRINE HCL 10 MG/ML SOLN (MIXTURES ONLY)
10 MG/ML | INTRAVENOUS | Status: DC | PRN
Start: 2021-11-27 — End: 2021-11-27
  Administered 2021-11-27: 18:00:00 40 via INTRAVENOUS

## 2021-11-27 MED ORDER — FAMOTIDINE (PF) 20 MG/2ML IV SOLN
20 MG/2ML | Freq: Two times a day (BID) | INTRAVENOUS | Status: DC
Start: 2021-11-27 — End: 2021-11-27

## 2021-11-27 MED ORDER — ACETAMINOPHEN 500 MG PO TABS
500 MG | Freq: Three times a day (TID) | ORAL | Status: AC
Start: 2021-11-27 — End: ?
  Administered 2021-11-28 – 2021-11-30 (×9): 1000 mg via ORAL

## 2021-11-27 MED ORDER — NORMAL SALINE FLUSH 0.9 % IV SOLN
0.9 % | INTRAVENOUS | Status: AC | PRN
Start: 2021-11-27 — End: ?

## 2021-11-27 MED ORDER — SODIUM CHLORIDE 0.9 % IV SOLN
0.9 % | INTRAVENOUS | Status: AC | PRN
Start: 2021-11-27 — End: ?

## 2021-11-27 MED ORDER — LABETALOL HCL 5 MG/ML IV SOLN
5 MG/ML | INTRAVENOUS | Status: AC
Start: 2021-11-27 — End: 2021-11-27
  Administered 2021-11-27: 13:00:00 20 via INTRAVENOUS

## 2021-11-27 MED ORDER — KETOROLAC TROMETHAMINE 30 MG/ML IJ SOLN
30 MG/ML | INTRAMUSCULAR | Status: DC | PRN
Start: 2021-11-27 — End: 2021-11-27
  Administered 2021-11-27: 19:00:00 30 via INTRAVENOUS

## 2021-11-27 MED ORDER — ONDANSETRON HCL 4 MG/2ML IJ SOLN
4 MG/2ML | INTRAMUSCULAR | Status: DC | PRN
Start: 2021-11-27 — End: 2021-11-27
  Administered 2021-11-27: 19:00:00 4 via INTRAVENOUS

## 2021-11-27 MED ORDER — LACTATED RINGERS IV SOLN
INTRAVENOUS | Status: AC
Start: 2021-11-27 — End: ?
  Administered 2021-11-27 – 2021-11-28 (×2): via INTRAVENOUS

## 2021-11-27 MED ORDER — LABETALOL HCL 5 MG/ML IV SOLN
5 MG/ML | Freq: Once | INTRAVENOUS | Status: AC
Start: 2021-11-27 — End: 2021-11-27
  Administered 2021-11-27: 13:00:00 40 mg via INTRAVENOUS

## 2021-11-27 MED ORDER — MISOPROSTOL 200 MCG PO TABS
200 | ORAL | Status: DC | PRN
Start: 2021-11-27 — End: 2021-11-30

## 2021-11-27 MED ORDER — LACTATED RINGERS IV SOLN
INTRAVENOUS | Status: DC | PRN
Start: 2021-11-27 — End: 2021-11-27
  Administered 2021-11-27: 18:00:00 via INTRAVENOUS

## 2021-11-27 MED ORDER — CALCIUM GLUCONATE 10 % IV SOLN
10 % | INTRAVENOUS | Status: AC | PRN
Start: 2021-11-27 — End: ?

## 2021-11-27 MED ORDER — STERILE WATER FOR INJECTION (MIXTURES ONLY)
1 g | Freq: Once | INTRAMUSCULAR | Status: AC
Start: 2021-11-27 — End: 2021-11-27
  Administered 2021-11-27: 17:00:00 2000 mg via INTRAVENOUS

## 2021-11-27 MED ORDER — LACTATED RINGERS IV SOLN
INTRAVENOUS | Status: AC
Start: 2021-11-27 — End: ?
  Administered 2021-11-28: 14:00:00 via INTRAVENOUS

## 2021-11-27 MED ORDER — MORPHINE SULFATE (PF) 1 MG/ML IJ SOLN
1 | INTRAMUSCULAR | Status: DC | PRN
Start: 2021-11-27 — End: 2021-11-30

## 2021-11-27 MED ORDER — MAGNESIUM SULFATE 20000 MG/500 ML INFUSION
20 GM/500ML | INTRAVENOUS | Status: AC
Start: 2021-11-27 — End: 2021-11-28
  Administered 2021-11-27 – 2021-11-28 (×3): 2000 mg/h via INTRAVENOUS

## 2021-11-27 MED ORDER — FENTANYL CITRATE (PF) 100 MCG/2ML IJ SOLN
100 MCG/2ML | INTRAMUSCULAR | Status: AC
Start: 2021-11-27 — End: 2021-11-27
  Administered 2021-11-27: 18:00:00 20 via INTRATHECAL

## 2021-11-27 MED ORDER — BUPIVACAINE IN DEXTROSE 0.75-8.25 % IT SOLN
INTRATHECAL | Status: AC
Start: 2021-11-27 — End: 2021-11-27
  Administered 2021-11-27: 18:00:00 12 via INTRATHECAL

## 2021-11-27 MED ORDER — TETANUS-DIPHTH-ACELL PERTUSSIS 5-2.5-18.5 LF-MCG/0.5 IM SUSY
INTRAMUSCULAR | Status: AC
Start: 2021-11-27 — End: ?

## 2021-11-27 MED ORDER — RHO D IMMUNE GLOBULIN 1500 UNITS IM SOSY
1500 units | Freq: Once | INTRAMUSCULAR | Status: AC | PRN
Start: 2021-11-27 — End: 2021-11-28

## 2021-11-27 MED ORDER — FAMOTIDINE (PF) 20 MG/2ML IV SOLN
202 MG/2ML | Freq: Once | INTRAVENOUS | Status: AC
Start: 2021-11-27 — End: 2021-11-27
  Administered 2021-11-27: 11:00:00 20 mg via INTRAVENOUS

## 2021-11-27 MED ORDER — MORPHINE SULFATE (PF) 2 MG/ML IJ SOLN
2 MG/ML | INTRAMUSCULAR | Status: AC | PRN
Start: 2021-11-27 — End: ?

## 2021-11-27 MED ORDER — NORMAL SALINE FLUSH 0.9 % IV SOLN
0.9 % | Freq: Two times a day (BID) | INTRAVENOUS | Status: AC
Start: 2021-11-27 — End: ?

## 2021-11-27 MED ORDER — KETOROLAC TROMETHAMINE 30 MG/ML IJ SOLN
30 MG/ML | Freq: Four times a day (QID) | INTRAMUSCULAR | Status: AC
Start: 2021-11-27 — End: 2021-11-28
  Administered 2021-11-28 (×3): 30 mg via INTRAVENOUS

## 2021-11-27 MED ORDER — PHENYLEPHRINE HCL (PRESSORS) 10 MG/ML IV SOLN
10 MG/ML | INTRAVENOUS | Status: AC
Start: 2021-11-27 — End: ?

## 2021-11-27 MED ORDER — DOCUSATE SODIUM 100 MG PO CAPS
100 MG | Freq: Two times a day (BID) | ORAL | Status: AC
Start: 2021-11-27 — End: ?
  Administered 2021-11-28 – 2021-11-30 (×6): 100 mg via ORAL

## 2021-11-27 MED ORDER — MORPHINE SULFATE (PF) 2 MG/ML IV SOLN
2 MG/ML | INTRAVENOUS | Status: AC | PRN
Start: 2021-11-27 — End: ?
  Administered 2021-11-27 – 2021-11-28 (×4): 2 mg via INTRAVENOUS

## 2021-11-27 MED ORDER — CALCIUM GLUCONATE-NACL 1-0.675 GM/50ML-% IV SOLN
1-0.67550- GM/50ML-% | Freq: Once | INTRAVENOUS | Status: AC
Start: 2021-11-27 — End: 2021-11-30

## 2021-11-27 MED ORDER — MORPHINE SULFATE (PF) 1 MG/ML IJ SOLN
1 MG/ML | INTRAMUSCULAR | Status: AC
Start: 2021-11-27 — End: ?

## 2021-11-27 MED ORDER — OXYTOCIN 30 UNITS IN 500 ML INFUSION
30 UNIT/500ML | INTRAVENOUS | Status: AC
Start: 2021-11-27 — End: 2021-11-27
  Administered 2021-11-27: 19:00:00 via INTRAVENOUS

## 2021-11-27 MED ORDER — LABETALOL HCL 5 MG/ML IV SOLN
5 MG/ML | Freq: Once | INTRAVENOUS | Status: AC
Start: 2021-11-27 — End: 2021-11-27

## 2021-11-27 MED ORDER — OXYTOCIN-LACTATED RINGERS 30 UNIT/500ML IV SOLN
30 UNIT/500ML | INTRAVENOUS | Status: DC | PRN
Start: 2021-11-27 — End: 2021-11-27
  Administered 2021-11-27: 19:00:00 909 via BUCCAL

## 2021-11-27 MED ORDER — MISOPROSTOL 25 MCG PRE-SPLIT TABLET
25 mcg | ORAL | Status: DC
Start: 2021-11-27 — End: 2021-11-27
  Administered 2021-11-27: 15:00:00 25 ug via BUCCAL

## 2021-11-27 MED ORDER — MORPHINE SULFATE (PF) 1 MG/ML IJ SOLN
1 MG/ML | INTRAMUSCULAR | Status: AC
Start: 2021-11-27 — End: 2021-11-27
  Administered 2021-11-27: 18:00:00 .15 via INTRATHECAL

## 2021-11-27 MED ORDER — OXYCODONE HCL 5 MG PO TABS
5 | ORAL | Status: DC | PRN
Start: 2021-11-27 — End: 2021-11-30
  Administered 2021-11-28 – 2021-11-30 (×10): 5 mg via ORAL

## 2021-11-27 MED ORDER — SODIUM CHLORIDE 0.9 % IV SOLN
0.9 % | Freq: Once | INTRAVENOUS | Status: AC
Start: 2021-11-27 — End: 2021-11-27
  Administered 2021-11-27: 18:00:00 500 mg via INTRAVENOUS

## 2021-11-27 MED FILL — SYNTHROID 50 MCG PO TABS: 50 MCG | ORAL | Qty: 2

## 2021-11-27 MED FILL — VALTREX 500 MG PO TABS: 500 MG | ORAL | Qty: 2

## 2021-11-27 MED FILL — LACTATED RINGERS IV SOLN: INTRAVENOUS | Qty: 1000

## 2021-11-27 MED FILL — NORMAL SALINE FLUSH 0.9 % IV SOLN: 0.9 % | INTRAVENOUS | Qty: 40

## 2021-11-27 MED FILL — DURAMORPH 1 MG/ML IJ SOLN: 1 MG/ML | INTRAMUSCULAR | Qty: 2

## 2021-11-27 MED FILL — MEDELA TENDER CARE LANOLIN EX CREA: CUTANEOUS | Qty: 7

## 2021-11-27 MED FILL — MAG-AL 200-200 MG/5ML PO LIQD: 200-200 MG/5ML | ORAL | Qty: 30

## 2021-11-27 MED FILL — CALCIUM GLUCONATE-NACL 1-0.675 GM/50ML-% IV SOLN: INTRAVENOUS | Qty: 50

## 2021-11-27 MED FILL — DURAMORPH 1 MG/ML IJ SOLN: 1 MG/ML | INTRAMUSCULAR | Qty: 10

## 2021-11-27 MED FILL — AZITHROMYCIN 500 MG IV SOLR: 500 MG | INTRAVENOUS | Qty: 500

## 2021-11-27 MED FILL — OXYTOCIN-LACTATED RINGERS 30 UNIT/500ML IV SOLN: 30 UNIT/500ML | INTRAVENOUS | Qty: 500

## 2021-11-27 MED FILL — LABETALOL HCL 5 MG/ML IV SOLN: 5 MG/ML | INTRAVENOUS | Qty: 20

## 2021-11-27 MED FILL — PHENYLEPHRINE HCL (PRESSORS) 10 MG/ML IV SOLN: 10 MG/ML | INTRAVENOUS | Qty: 1

## 2021-11-27 MED FILL — FENTANYL CITRATE (PF) 100 MCG/2ML IJ SOLN: 100 MCG/2ML | INTRAMUSCULAR | Qty: 2

## 2021-11-27 MED FILL — MORPHINE SULFATE 2 MG/ML IJ SOLN: 2 mg/mL | INTRAMUSCULAR | Qty: 1

## 2021-11-27 MED FILL — FAMOTIDINE (PF) 20 MG/2ML IV SOLN: 20 MG/2ML | INTRAVENOUS | Qty: 2

## 2021-11-27 MED FILL — CEFAZOLIN SODIUM 1 G IJ SOLR: 1 g | INTRAMUSCULAR | Qty: 2000

## 2021-11-27 MED FILL — CALCIUM GLUCONATE 10 % IV SOLN: 10 % | INTRAVENOUS | Qty: 10

## 2021-11-27 MED FILL — ONDANSETRON HCL 4 MG/2ML IJ SOLN: 4 MG/2ML | INTRAMUSCULAR | Qty: 2

## 2021-11-27 MED FILL — MAGNESIUM SULFATE 4 GM/100ML IV SOLN: 4 GM/100ML | INTRAVENOUS | Qty: 100

## 2021-11-27 MED FILL — MISOPROSTOL 25 MCG PRE-SPLIT TABLET: 25 mcg | ORAL | Qty: 1

## 2021-11-27 MED FILL — MAGNESIUM SULFATE 20 GM/500ML IV SOLN: 20 GM/500ML | INTRAVENOUS | Qty: 500

## 2021-11-27 NOTE — H&P (Signed)
History & Physical    Name: Lynn Perry MRN: [id number]  SSN: [id number]    Date of Birth: 1995/10/21  Age: 26 y.o.  Sex: female        Subjective:     Estimated Date of Delivery: 01/08/22  OB History       Gravida   1    Para        Term        Preterm        AB        Living             SAB        IAB        Ectopic        Molar        Multiple        Live Births                    Ms. Kovich is admitted with pregnancy at 65w0dfor  preeclampsia with SF . Prenatal course was complicated by severe FGR. Please see prenatal records for details.    Patient Active Problem List    Diagnosis Date Noted    [redacted] weeks gestation of pregnancy 10/06/2021     Priority: High     Primary Provider: SSydell Axon- SAB x1 January 2019  [facility] [place] with [facility] of 01/08/22  [place] 28 [place] Husband is a pEducation officer, environmental  IOB labs: B POSITIVE/RI/HIV neg/RPR neg/HEP C neg/Hep B neg/Hgb 13.4/Rubella NON-IMMUNE  - gc/c negative.   - TSH 1.660  Genetic Screening:Panoroma LOW RISK - FEMALE, Horizon NEGATIVE 4/4.  Anatomy: 07/13/2021, Normal FAS  3rd TM labs: GTT 10/05/2021  Flu:__ TDAP: Admin 10/05/2021  Rhogam: B POSITIVE  GBS:  Postpartum Care: Breast. Circ      Problem List:  1.Severe FGR EFW <1%tile with elevated UA dopplers  -following with MFM weekly   2. Hypothyroidism   -on 72m of synthroid  3.Bells Palsy in pregnancy  -resolved  -on prednisone and valtrex           Preeclampsia, third trimester 11/27/2021    Non-reassuring electronic fetal monitoring tracing 11/25/2021       Past Medical History:   Diagnosis Date    Hypothyroidism     Major depressive disorder     Preeclampsia, third trimester 11/27/2021    Psychogenic nonepileptic seizure     Rubella non-immune status, antepartum     Trauma      Past Surgical History:   Procedure Laterality Date    LYMPH NODE DISSECTION      TONSILLECTOMY Bilateral 2006     Social History     Socioeconomic History    Marital status: Single   Tobacco Use    Smoking  status: Never    Smokeless tobacco: Never   Vaping Use    Vaping Use: Never used   Substance and Sexual Activity    Alcohol use: Not Currently    Sexual activity: Yes     Partners: Male     Birth control/protection: None     Family History   Problem Relation Age of Onset    Lupus Mother         +Diverticulitis    Hypertension Father        Allergies   Allergen Reactions    Contrast [Iodides]      Prior to Admission medications  Medication Sig Start Date End Date Taking? Authorizing Provider   levothyroxine (SYNTHROID) 75 MCG tablet Take 1 tablet by mouth daily 10/30/21   Lilian Kapur, MD   cyclobenzaprine (FLEXERIL) 5 MG tablet Take 1 tablet by mouth 2 times daily as needed for Muscle spasms  Patient not taking: Reported on 11/25/2021    Historical Provider, MD   famotidine (PEPCID) 20 MG tablet Take 1 tablet by mouth 2 times daily as needed    Historical Provider, MD   Prenatal Vit-Fe Fumarate-FA (PRENATAL VITAMIN) 27-1 MG TABS tablet Take 1 tablet by mouth daily    Historical Provider, MD   Blood Pressure Monitoring (BLOOD PRESSURE KIT) DEVI 1 dose pack by Does not apply route daily    Historical Provider, MD   Misc. Devices (GOJJI WEIGHT SCALE) MISC 1 Dose by Does not apply route daily    Historical Provider, MD        Review of Systems: Pertinent items are noted in HPI.    Objective:     Vitals:  Vitals:    11/27/21 1023 11/27/21 1056 11/27/21 1100 11/27/21 1201   BP: 129/84  123/76 121/70   Pulse: 69 72 70 68   Resp:    12   Temp: 97.8 F (36.6 C)      TempSrc: Oral      SpO2:  99%  98%        Physical Exam:  Patient without distress  Membranes:  Intact  Fetal Heart Rate: Reactive    Prenatal Labs:   No components found for: "OBEXTABORH", "OBEXTABSCRN", "OBEXTRUBELLA", "OBEXTGRBS", "OBEXTHBSAG", "OBEXTHIV", "OBEXTRPR", "OBEXTGONORR", "OBEXTCHLAM"     Assessment/Plan:   Cara Thaxton is a 26 y.o.  G1P0 at [redacted]w[redacted]d  Preeclampsia with SF (BP's)  PC ratio on admission 0.3.   Severe FGR EFW <1st%tile   Last week with  elevated UA dopplers  Repeat dopplers normal on Friday   1280 grams on 11/17/21  GBS pending      Plan: Admit for induction of labor. Patient understands possibility of cesarean section.     Signed By:  SLilian Kapur MD     November 27, 2021

## 2021-11-27 NOTE — Progress Notes (Addendum)
1148 - Bedside and Verbal shift change report given to H. Chaunda Vandergriff RNC (oncoming nurse) by Kirtland Bouchard. Whitaker Museum/gallery conservator). Report included the following information Nurse Handoff Report, Intake/Output, MAR, Recent Results, and Med Rec Status.     1230 - Dr Lambert Mody viewed strip since ~1020.     1252 - Dr Lambert Mody at bedside to recommend cesarean section delivery due to FHR pattern; answering pt and her mother's questions.    1255 - Cervical Ripening balloon removed. Vaginal bleeding noted with Dr Lambert Mody still at bedside.    1313 - Dr Randa Evens at bedside to assess pt, discuss plan of care and answer questions.    1340 - Problem with Pyxis in OR1; awaiting Pharmacy to fix before pt can roll back to OR.

## 2021-11-27 NOTE — Progress Notes (Signed)
Labor Progress Note:    In room to discuss fetal heart rate pattern. Since starting magnesium there have been recurrent late decelerations that have not resolved with intrauterine resuscitation. Fetal hearts with minimal variability which may likely be related to magnesium however given recurrent late decelerations in the setting of severe growth restriction recommended cesarean section.     Discussed risks of surgery which include but are not limited infection, bleeding, transfusion, VTE, readmission, abscess drainage, injury to abdominal organs including bowel, bladder, ureters, vessels, nerves, injury to organs that may not be recognized at time of initial surgery requiring reoperation and risk of death.    Anesthesia and OR notified. Antibiotics ordered.

## 2021-11-27 NOTE — Progress Notes (Addendum)
0740 Report received from Roselle Locus, RN    [phone] Pt had episode of emesis.    1914 Dr.Sharp called for Bps. 163/106. Previous BP 170s. Dr.Sharp would like to treat with IV Labetalol.     7829 40mg  labetalol. BP 163/102.    0916 Dr.Sharp to bedside. Talking about plan of care. Will start induction at this time for pre-E w/ severe features. Increased Bps, HA. Will start magnesium for pre-E prophylaxis.     137/94.    5621 Dr.Sharp still at bedside. SVE 1/thick/high. Foley balloon inserted uterine 35ml. Pt off monitor to shower, okay per Dr.Sharp.     1015 Pt back in back after shower. Slight bleeding noted after foley balloon placement. Dr.Sharp aware. Will continue to monitor.     1033 Magnesium bolus started 4gm.    1035 Cytotec 25mg  given.    1055 Magnesium 2mg  started.    1145 Report given to H. 72m, RN

## 2021-11-27 NOTE — Anesthesia Procedure Notes (Signed)
Spinal Block    Patient location during procedure: OR  End time: 11/27/2021 2:00 PM  Reason for block: primary anesthetic  Staffing  Performed: anesthesiologist   Anesthesiologist: Lillie Columbia, MD  Performed by: Lillie Columbia, MD  Authorized by: Lillie Columbia, MD    Spinal Block  Patient position: sitting  Prep: Betadine  Patient monitoring: cardiac monitor, continuous pulse ox, frequent blood pressure checks and oxygen  Approach: midline  Location: L4/L5  Provider prep: mask and sterile gloves  Needle  Needle type: pencil-tip   Needle gauge: 25 G  Needle length: 4 in  Assessment  Sensory level: T4  Swirl obtained: Yes  CSF: clear  Attempts: 1  Hemodynamics: stable  Preanesthetic Checklist  Completed: patient identified, IV checked, site marked, risks and benefits discussed, surgical/procedural consents, equipment checked, pre-op evaluation, timeout performed, anesthesia consent given, oxygen available, monitors applied/VS acknowledged and fire risk safety assessment completed and verbalized

## 2021-11-27 NOTE — Op Note (Signed)
[facility]  OPERATIVE REPORT    Name:  Lynn Perry, Lynn Perry  MR#:  [id number]  DOB:  08/26/1995  ACCOUNT #:  [id number]  DATE OF SERVICE:  11/27/2021    PREOPERATIVE DIAGNOSES:  1.  G1 P0 at 34 weeks 0 days.  2.  Preeclampsia with severe features.  3.  Fetal growth restriction, severe, less than first percentile.  4.  Hypothyroidism.  5. NRFHTs    POSTOPERATIVE DIAGNOSES:  1.  G1 P0 at 34 weeks 0 days.  2.  Preeclampsia with severe features.  3.  Fetal growth restriction, severe, less than first percentile.  4.  Hypothyroidism.  5.  Liveborn female neonate.  6.  NRFHTs    PROCEDURE PERFORMED:  Primary low transverse cesarean section.    SURGEON:  Renu Asby L. Lambert Mody, MD    ASSISTANT:  Lonie Peak, RN    ANESTHESIA:  spinal    COMPLICATIONS:  None.    SPECIMENS REMOVED:  Pathology specimen, placenta.    IMPLANTS:  none     ESTIMATED BLOOD LOSS:  400 mL.    QUANTITATIVE BLOOD LOSS:  Pending at the time of dictation.    FINDINGS:  Grossly normal-appearing uterus and adnexa, murky fluid noted at the time of amniotomy, non-odorous fluid.  No cord entanglement.  Small placenta with three-vessel.    PROCEDURE:  After informed consent was obtained, the patient was taken to the operating room and placed under satisfactory anesthesia.  She was placed in dorsal lithotomy position with left lateral tilt.  She was prepped and draped in normal sterile fashion.  Time-out was performed to ensure correct patient and correct procedure.  A Pfannenstiel incision was made with the scalpel approximately 2 cm above the pubic symphysis and carried down to the underlying layer of fascia.  The fascia was nicked in the midline.  The incision was extended laterally with Mayo scissors.    The superficial aspect of the fascial incision was elevated on either side of the midline with Kocher clamps, and the rectus muscle was taken down with blunt and Lyndie Vanderloop dissection using Mayo's.  Same was done for the inferior aspect of the fascial layer.   The rectus muscle was identified and entered bluntly in the midline.  The peritoneum was identified and entered bluntly.  Peritoneal opening increased with gentle traction.  A bladder blade was placed.  The vesicouterine peritoneum was identified and grasped with pickups and entered sharply with Metzenbaum scissors.  The incision was extended laterally and the bladder flap was created digitally.    The bladder blade was replaced and a low transverse incision was made on the uterus.  Amniotomy was performed and murky grey tinged fluid was noted.  The neonates head was elevated and flexed then delivered through the hysterotomy.  The body and shoulders easily followed.  Delayed cord clamping was performed for one minute.  The cord was then clamped and cut on the field after the baby was suctioned.  The baby was handed off to the NICU staff.  The placenta was removed with gentle traction and found to be intact and sent to Pathology.  The uterus was then exteriorized wrapped with wet lap, explored and cleared of clots and debris.  The uterus was sutured using 0 Vicryl in a running locked fashion and tied.  An imbricating stitch was performed with 0 Monocryl for hemostasis.    The uterus was placed back in the abdominal cavity.  The hysterotomy was revisited and noted to  be hemostatic.  All gutters were cleared of any remaining clots and debris.  The fascia was identified and closed with a running CT-1 of Vicryl.  The subcu was then irrigated and suctioned dry.  The subcu was closed with gut chromic on an XLH needle.  The skin was closed using 3-0 Monocryl on a Keith needle.  Steri-Strips were placed followed by a pressure dressing. The patient tolerated the procedure well and returned to the recovery room in stable condition.  All sponge, needle, and instrument counts were correct at the end of the procedure per OR staff.      Rachael Darby, MD      [id number]  D:  11/27/2021 18:31  T:  11/27/2021 23:27  JOB  #:  [id number]

## 2021-11-27 NOTE — Progress Notes (Signed)
TRANSFER - IN REPORT:    Verbal report received from Josetta Huddle, RN on Lynn Perry being received from L&D for routine progression of patient care      Report consisted of patient's Situation, Background, Assessment and   Recommendations(SBAR).     Information from the following report(s) Nurse Handoff Report, Index, Adult Overview, Surgery Report, Intake/Output, MAR, and Recent Results was reviewed with the receiving nurse.    Opportunity for questions and clarification was provided.      Assessment completed upon patient's arrival to unit and care assumed.

## 2021-11-27 NOTE — Progress Notes (Signed)
Patient admitted for threatened PTL and intermittent fetal heart rate deceleration. Patient was awaiting MFM consultation this AM.  This morning she has developed new onset severe range blood pressures. Requiring 20 and 40 of labetalol. She reports an associated frontal headache 6/10 and epigastric discomfort.     BP 139/74   Pulse 70   Temp 98.5 F (36.9 C) (Oral)   Resp 16   Cervix: 1/thick/high cephalic on V scan, FB placed without difficulty. 60 cc intrauterine   Toco: rare  FHT: 125, moderate variability, accelerations present, rare variable decelerations. Overall reassuring      A:  Lynn Perry is a 27 y.o.  G1P0 at [redacted]w[redacted]d   Preeclampsia with SF (BP's)  PC ratio on admission 0.3.   Repeat CBC/CMP/LDH ordered   Severe FGR EFW <1st%tile   Last week with elevated UA dopplers  Repeat dopplers normal on Friday   GBS pending       P:  Start mag   FB in with 60 cc in intrauterine  Repeat labs   Buccal cytotec   PCN when in active labor     Graeson Nouri L. Lambert Mody, MD

## 2021-11-27 NOTE — Progress Notes (Incomplete Revision)
1148 - Bedside and Verbal shift change report given to H. Jabree Pernice RNC (oncoming nurse) by Kirtland Bouchard. [employer]/gallery conservator). Report included the following information Nurse Handoff Report, Intake/Output, MAR, Recent Results, and Med Rec Status.     1230 - Dr Lambert Mody viewed strip since ~1020.     1252 - Dr Lambert Mody at bedside to recommend cesarean section delivery due to FHR pattern; answering pt and her mother's questions.    1255 - Cervical Ripening balloon removed. Vaginal bleeding noted with Dr Lambert Mody still at bedside.    1313 - Dr Randa Evens at bedside to assess pt, discuss plan of care and answer questions.    1340 - Problem with Pyxis in OR1; awaiting Pharmacy to fix before pt can roll back to OR.    1735 - TRANSFER - OUT REPORT:    Verbal report given to [facility] T. RN on [name]  being transferred to [facility] Rm 315 for routine progression of patient care       Report consisted of patient's Situation, Background, Assessment and   Recommendations(SBAR).     Information from the following report(s) Nurse Handoff Report, Surgery Report, Intake/Output, MAR, Recent Results, and Med Rec Status was reviewed with the receiving nurse.           Lines:   Peripheral IV 11/25/21 Distal;Left;Posterior Forearm (Active)   Site Assessment Clean, dry & intact 11/27/21 1801   Line Status Infusing 11/27/21 1801   Line Care Connections checked and tightened 11/27/21 1801   Phlebitis Assessment No symptoms 11/27/21 1801   Infiltration Assessment 0 11/27/21 1801   Alcohol Cap Used Yes 11/27/21 1801   Dressing Status Clean, dry & intact 11/27/21 1801        Opportunity for questions and clarification was provided.      Patient transported with:  Registered Nurse via regular hospital bed.

## 2021-11-27 NOTE — Brief Op Note (Addendum)
Brief Postoperative Note      Patient: Lynn Perry  Date of Birth: [birth year]  MRN: [id number]    Date of Procedure: 11/27/2021    Pre Op Diagnosis:  1) G1P0 at [redacted]w[redacted]d   2) Preeclampsia with Severe features (BP's)  3) Severe FGR (<1st%tile)   4) Hypothyroidism       Post-Op Diagnosis: Same + Liveborn female neonate        Procedure(s):  PRIMARY LOW TRANSVERSE CESAREAN SECTION    Surgeon(s):  Rachael Darby, MD    Assistant:  Lonie Peak, RN    Anesthesia: Spinal    Estimated Blood Loss (mL): 400 QBL pending     Complications: None    Specimens:   Placenta     Implants:  None       Drains:   Urinary Catheter 11/27/21 Foley (Active)       Findings: liveborn female neonate in cephalic presentation. Murky amniotic fluid. Non odorous fluid. Small placenta, intact with 3 vessel cord. Grossly normal uterus and adnexa.       Electronically signed by Rachael Darby, MD on 11/27/2021 at 3:10 PM

## 2021-11-27 NOTE — Anesthesia Pre-Procedure Evaluation (Signed)
Department of Anesthesiology  Preprocedure Note       Name:  Lynn Perry   Age:  26 y.o.  DOB:  1995-10-24                                          MRN:  [id number]         Date:  11/27/2021      Surgeon: Juliann Mule):  Lilian Kapur, MD    Procedure: Procedure(s):  CESAREAN SECTION    Medications prior to admission:   Prior to Admission medications    Medication Sig Start Date End Date Taking? Authorizing Provider   levothyroxine (SYNTHROID) 75 MCG tablet Take 1 tablet by mouth daily 10/30/21   Lilian Kapur, MD   cyclobenzaprine (FLEXERIL) 5 MG tablet Take 1 tablet by mouth 2 times daily as needed for Muscle spasms  Patient not taking: Reported on 11/25/2021    Historical Provider, MD   famotidine (PEPCID) 20 MG tablet Take 1 tablet by mouth 2 times daily as needed    Historical Provider, MD   Prenatal Vit-Fe Fumarate-FA (PRENATAL VITAMIN) 27-1 MG TABS tablet Take 1 tablet by mouth daily    Historical Provider, MD   Blood Pressure Monitoring (BLOOD PRESSURE KIT) DEVI 1 dose pack by Does not apply route daily    Historical Provider, MD   Misc. Devices (GOJJI WEIGHT SCALE) MISC 1 Dose by Does not apply route daily    Historical Provider, MD       Current medications:    Current Facility-Administered Medications   Medication Dose Route Frequency Provider Last Rate Last Admin   . lactated ringers IV soln infusion   IntraVENous Continuous Lilian Kapur, MD 75 mL/hr at 11/27/21 1029 New Bag at 11/27/21 1029   . sodium chloride flush 0.9 % injection 5-40 mL  5-40 mL IntraVENous 2 times per day Lilian Kapur, MD       . sodium chloride flush 0.9 % injection 5-40 mL  5-40 mL IntraVENous PRN Lilian Kapur, MD       . 0.9 % sodium chloride infusion   IntraVENous PRN Lilian Kapur, MD       . magnesium sulfate (20000 mg/517m infusion)  2,000 mg/hr IntraVENous Continuous SLilian Kapur MD 50 mL/hr at 11/27/21 1151 2,000 mg/hr at 11/27/21 1151   . calcium gluconate 10 % injection 1,000 mg  1,000 mg IntraVENous PRN SLilian Kapur MD       . miSOPROStol (CYTOTEC) pre-split tablet TABS 25 mcg  25 mcg Buccal 6 times per day SLilian Kapur MD   25 mcg at 11/27/21 1035   . morphine (PF) injection 2 mg  2 mg IntraVENous Q4H PRN SLilian Kapur MD       . azithromycin (ZITHROMAX) 500 mg in sodium chloride 0.9 % 250 mL IVPB (Vial2Bag)  500 mg IntraVENous Once SLilian Kapur MD 250 mL/hr at 11/27/21 1343 500 mg at 11/27/21 1343   . oxytocin (PITOCIN) 30 units in 500 mL infusion Override Pull            . famotidine (PEPCID) tablet 20 mg  20 mg Oral BID PRN RKristeen Mans MD       . aluminum-magnesium hydroxide 200-200 MG/5ML suspension 30 mL  30 mL Oral Q6H PRN RKristeen Mans MD       .  acetaminophen (TYLENOL) tablet 650 mg  650 mg Oral Q4H PRN Maylon Cos, MD   650 mg at 11/25/21 0918   . lactated ringers bolus bolus 500 mL  500 mL IntraVENous Once Maylon Cos, MD 999 mL/hr at 11/25/21 1529 Rate Change at 11/25/21 1529   . levothyroxine (SYNTHROID) tablet 75 mcg  75 mcg Oral Daily Boris Lown, APRN - CNM   75 mcg at 11/27/21 1031   . valACYclovir (VALTREX) tablet 1,000 mg  1,000 mg Oral TID Boris Lown, APRN - CNM   1,000 mg at 11/27/21 1032   . ondansetron (ZOFRAN) injection 4 mg  4 mg IntraVENous Q6H PRN Boris Lown, APRN - CNM   4 mg at 11/27/21 9323   . cyclobenzaprine (FLEXERIL) tablet 10 mg  10 mg Oral TID PRN Boris Lown, APRN - CNM   10 mg at 11/25/21 2327     Facility-Administered Medications Ordered in Other Encounters   Medication Dose Route Frequency Provider Last Rate Last Admin   . phenylephrine (NEO-SYNEPHRINE) 50 mg in sodium chloride 0.9 % 250 mL infusion   IntraVENous Continuous PRN Pete Pelt, APRN - CRNA 12 mL/hr at 11/27/21 1353 40 mcg/min at 11/27/21 1353   . lactated ringers IV soln infusion   IntraVENous Continuous PRN Pete Pelt, APRN - CRNA   New Bag at 11/27/21 1351       Allergies:    Allergies   Allergen Reactions   . Contrast [Iodides]        Problem List:    Patient Active Problem  List   Diagnosis Code   . [redacted] weeks gestation of pregnancy Z3A.28   . Non-reassuring electronic fetal monitoring tracing [id number]   . Preeclampsia, third trimester O14.93       Past Medical History:        Diagnosis Date   . Hypothyroidism    . Major depressive disorder    . Preeclampsia, third trimester 11/27/2021   . Psychogenic nonepileptic seizure    . Rubella non-immune status, antepartum    . Trauma        Past Surgical History:        Procedure Laterality Date   . LYMPH NODE DISSECTION     . TONSILLECTOMY Bilateral 2006       Social History:    Social History     Tobacco Use   . Smoking status: Never   . Smokeless tobacco: Never   Substance Use Topics   . Alcohol use: Not Currently                                Counseling given: Not Answered      Vital Signs (Current):   Vitals:    11/27/21 1100 11/27/21 1201 11/27/21 1342 11/27/21 1404   BP: 123/76 121/70 134/79    Pulse: 70 68 82 75   Resp:  12     Temp:   97.7 F (36.5 C)    TempSrc:   Oral    SpO2:  98%                                                BP Readings from Last 3 Encounters:   11/27/21 134/79   11/23/21 128/80   11/19/21 Marland Kitchen)  138/97       NPO Status:                                                                                 BMI:   Wt Readings from Last 3 Encounters:   11/23/21 72.6 kg (160 lb)   11/15/21 71.2 kg (157 lb)   10/30/21 70.8 kg (156 lb)     There is no height or weight on file to calculate BMI.    CBC:   Lab Results   Component Value Date/Time    WBC 16.8 11/27/2021 08:44 AM    RBC 4.04 11/27/2021 08:44 AM    HGB 12.0 11/27/2021 08:44 AM    HCT 36.7 11/27/2021 08:44 AM    MCV 90.8 11/27/2021 08:44 AM    RDW 13.3 11/27/2021 08:44 AM    PLT 209 11/27/2021 08:44 AM       CMP:   Lab Results   Component Value Date/Time    NA 142 11/27/2021 08:44 AM    K 3.9 11/27/2021 08:44 AM    CL 114 11/27/2021 08:44 AM    CO2 23 11/27/2021 08:44 AM    BUN 9 11/27/2021 08:44 AM    CREATININE 0.66 11/27/2021 08:44 AM    LABGLOM >60 11/27/2021 08:44  AM    GLUCOSE 82 11/27/2021 08:44 AM    PROT 6.0 11/27/2021 08:44 AM    CALCIUM 8.9 11/27/2021 08:44 AM    BILITOT 0.3 11/27/2021 08:44 AM    ALKPHOS 119 11/27/2021 08:44 AM    AST 13 11/27/2021 08:44 AM    ALT 16 11/27/2021 08:44 AM       POC Tests: No results for input(s): "POCGLU", "POCNA", "POCK", "POCCL", "POCBUN", "POCHEMO", "POCHCT" in the last 72 hours.    Coags: No results found for: "PROTIME", "INR", "APTT"    HCG (If Applicable): No results found for: "PREGTESTUR", "PREGSERUM", "HCG", "HCGQUANT"     ABGs: No results found for: "PHART", "PO2ART", "PCO2ART", "HCO3ART", "BEART", "O2SATART"     Type & Screen (If Applicable):  No results found for: "LABABO", "LABRH"    Drug/Infectious Status (If Applicable):  No results found for: "HIV", "HEPCAB"    COVID-19 Screening (If Applicable): No results found for: "COVID19"        Anesthesia Evaluation  Patient summary reviewed and Nursing notes reviewed  Airway: Mallampati: II  TM distance: >3 FB   Neck ROM: full  Mouth opening: > = 3 FB   Dental: normal exam         Pulmonary:Negative Pulmonary ROS breath sounds clear to auscultation                             Cardiovascular:  Exercise tolerance: good (>4 METS),   (+) hypertension:,         Rhythm: regular  Rate: normal                    Neuro/Psych:   Negative Neuro/Psych ROS              GI/Hepatic/Renal: Neg GI/Hepatic/Renal ROS  Endo/Other:    (+) hypothyroidism::., .                  ROS comment: Pre-E Abdominal:             Vascular: negative vascular ROS.         Other Findings:           Anesthesia Plan      spinal     ASA 2       Induction: intravenous.      Anesthetic plan and risks discussed with patient.    Use of blood products discussed with patient whom consented to blood products.     Attending anesthesiologist reviewed and agrees with Preprocedure content      Post-op pain plan if not by surgeon: intrathecal narcotics            Lorelee New, MD   11/27/2021

## 2021-11-28 ENCOUNTER — Encounter: Payer: MEDICAID | Attending: Obstetrics & Gynecology

## 2021-11-28 LAB — CBC
Hematocrit: 28.8 % — ABNORMAL LOW (ref 35.0–47.0)
Hematocrit: 31.7 % — ABNORMAL LOW (ref 35.0–47.0)
Hemoglobin: 9.7 g/dL — ABNORMAL LOW (ref 11.5–16.0)
Hemoglobin: 10.6 g/dL — ABNORMAL LOW (ref 11.5–16.0)
MCH: 30.9 PG (ref 26.0–34.0)
MCH: 30.8 PG (ref 26.0–34.0)
MCHC: 33.7 g/dL (ref 30.0–36.5)
MCHC: 33.4 g/dL (ref 30.0–36.5)
MCV: 91.7 FL (ref 80.0–99.0)
MCV: 92.2 FL (ref 80.0–99.0)
MPV: 12 FL (ref 8.9–12.9)
MPV: 12 FL (ref 8.9–12.9)
Nucleated RBCs: 0 PER 100 WBC
Nucleated RBCs: 0 PER 100 WBC
Platelets: 165 10*3/uL (ref 150–400)
Platelets: 183 10*3/uL (ref 150–400)
RBC: 3.14 M/uL — ABNORMAL LOW (ref 3.80–5.20)
RBC: 3.44 M/uL — ABNORMAL LOW (ref 3.80–5.20)
RDW: 13.5 % (ref 11.5–14.5)
RDW: 13.5 % (ref 11.5–14.5)
WBC: 16.3 10*3/uL — ABNORMAL HIGH (ref 3.6–11.0)
WBC: 14.3 10*3/uL — ABNORMAL HIGH (ref 3.6–11.0)
nRBC: 0 10*3/uL (ref 0.00–0.01)
nRBC: 0 10*3/uL (ref 0.00–0.01)

## 2021-11-28 LAB — CULTURE, STREP B SCREEN, VAGINAL/RECTAL

## 2021-11-28 LAB — COMPREHENSIVE METABOLIC PANEL
ALT: 17 U/L (ref 12–78)
AST: 15 U/L (ref 15–37)
Albumin/Globulin Ratio: 0.7 — ABNORMAL LOW (ref 1.1–2.2)
Albumin: 2.2 g/dL — ABNORMAL LOW (ref 3.5–5.0)
Alk Phosphatase: 91 U/L (ref 45–117)
Anion Gap: 3 mmol/L — ABNORMAL LOW (ref 5–15)
BUN: 7 MG/DL (ref 6–20)
Bun/Cre Ratio: 10 — ABNORMAL LOW (ref 12–20)
CO2: 24 mmol/L (ref 21–32)
Calcium: 7.1 MG/DL — ABNORMAL LOW (ref 8.5–10.1)
Chloride: 108 mmol/L (ref 97–108)
Creatinine: 0.73 MG/DL (ref 0.55–1.02)
Est, Glom Filt Rate: >60 mL/min/{1.73_m2} (ref >60)
Globulin: 3 g/dL (ref 2.0–4.0)
Glucose: 96 mg/dL (ref 65–100)
Potassium: 4.3 mmol/L (ref 3.5–5.1)
Sodium: 135 mmol/L — ABNORMAL LOW (ref 136–145)
Total Bilirubin: 0.2 MG/DL (ref 0.2–1.0)
Total Protein: 5.2 g/dL — ABNORMAL LOW (ref 6.4–8.2)

## 2021-11-28 MED ORDER — DIPHENHYDRAMINE HCL 50 MG/ML IJ SOLN
50 MG/ML | Freq: Once | INTRAMUSCULAR | Status: AC
Start: 2021-11-28 — End: 2021-11-28
  Administered 2021-11-28: 05:00:00 12.5 mg via INTRAVENOUS

## 2021-11-28 MED ORDER — IBUPROFEN 400 MG PO TABS
400 MG | Freq: Three times a day (TID) | ORAL | Status: AC
Start: 2021-11-28 — End: ?
  Administered 2021-11-28 – 2021-11-30 (×7): 800 mg via ORAL

## 2021-11-28 MED ORDER — DIPHENHYDRAMINE HCL 50 MG/ML IJ SOLN
50 MG/ML | Freq: Four times a day (QID) | INTRAMUSCULAR | Status: AC | PRN
Start: 2021-11-28 — End: 2021-11-29
  Administered 2021-11-28 (×2): 25 mg via INTRAVENOUS

## 2021-11-28 MED FILL — DOK 100 MG PO CAPS: 100 MG | ORAL | Qty: 1

## 2021-11-28 MED FILL — VALTREX 500 MG PO TABS: 500 MG | ORAL | Qty: 2

## 2021-11-28 MED FILL — ACETAMINOPHEN EXTRA STRENGTH 500 MG PO TABS: 500 MG | ORAL | Qty: 2

## 2021-11-28 MED FILL — MORPHINE SULFATE 2 MG/ML IJ SOLN: 2 mg/mL | INTRAMUSCULAR | Qty: 1

## 2021-11-28 MED FILL — KETOROLAC TROMETHAMINE 30 MG/ML IJ SOLN: 30 MG/ML | INTRAMUSCULAR | Qty: 1

## 2021-11-28 MED FILL — DIPHENHYDRAMINE HCL 50 MG/ML IJ SOLN: 50 MG/ML | INTRAMUSCULAR | Qty: 1

## 2021-11-28 MED FILL — MAGNESIUM SULFATE 20 GM/500ML IV SOLN: 20 GM/500ML | INTRAVENOUS | Qty: 500

## 2021-11-28 MED FILL — OXYCODONE HCL 5 MG PO TABS: 5 MG | ORAL | Qty: 1

## 2021-11-28 MED FILL — SYNTHROID 75 MCG PO TABS: 75 MCG | ORAL | Qty: 1

## 2021-11-28 MED FILL — IBUPROFEN 400 MG PO TABS: 400 MG | ORAL | Qty: 2

## 2021-11-28 NOTE — Progress Notes (Signed)
1200: Bedside and Verbal shift change report given to S.Remmie Bembenek (oncoming nurse) by T.KAHL (offgoing nurse). Report included the following information Nurse Handoff Report.

## 2021-11-28 NOTE — Progress Notes (Signed)
Anesthesia Post Operative Day 1    Patient is s/p neuraxial Duramorph for Cesarean Section.    The patient relates mild discomfort, no itching and no nausea. There are no motor/sensation abnormalities and no complaints of a headache.    Anesthesia site is normal, without erythema or tenderness. All symptoms were treated with protocol medications with good results.    Patient is up and ambulating without complaints.     Plan: Continue Duramorph protocol as needed. Reconsult PRN.    Rometta Emery, MD  5:30 PM

## 2021-11-28 NOTE — Lactation Note (Signed)
Infant born at 79 weeks and is in NICU.  Pt will successfully establish breast milk supply by pumping with a hospital grade pump every 2-3 hours for approximately 20 minutes/8-10 x day with the correct size flange, and suction level for mother's comfort.  To maximize milk production, mom taught to incorporate breast massage and hand expression into pumping sessions.  All expressed breast milk (EBM) will be provided for infant use, in clean bottles/syringes for storage in NICU breastmilk refrigerator. Patient label with barcode,date and time applied to each container prior to transport to NICU. Proper cleaning of pump parts and good hand hygiene discussed. Mother is advised to rent a hospital grade pump to continue regimen at home. Progress of milk transition, pumping log, expected EBM volumes, care of engorged breasts discussed.The value of bonding with baby emphasized, strategies for participating in infant care, photos, footprints, touch, and holding skin to skin as soon as baby and mother are able have been shown to increase oxytocin levels.  The breast will be offered as baby is ready; with the goal of eventual transition to breastfeeding.

## 2021-11-28 NOTE — Discharge Instructions (Addendum)
Please record JP drains output.

## 2021-11-28 NOTE — Progress Notes (Signed)
Bedside and Verbal shift change report given to T. Claris Gower RN  (oncoming nurse) by Reynolds Bowl RN  (offgoing nurse). Report included the following information SBAR, Kardex, Intake/Output, MAR, and Recent Results.         MD at bedside. Pt with c/o itching. Orders received for benadryl  Dressing removed. Orders to reapply special tape to incision.     0800- MD Lambert Mody perfect severed regarding valtrex order. Pt reports being on medication for over the 10 days, started medication 11/12/21. Orders to stop medication.

## 2021-11-28 NOTE — Progress Notes (Addendum)
Post-Operative Day Number 1 OBGYN Progress Note    Patient doing well post-op day 1 from cesarean delivery without significant complaints.  Pain some what controlled. Has only taken motrin and tylenol. Normal lochia. Passing gas.     Vitals:  Patient Vitals for the past 8 hrs:   BP Temp Temp src Pulse Resp SpO2   11/28/21 1210 124/76 -- -- -- -- 99 %   11/28/21 1106 115/69 -- -- 89 18 97 %   11/28/21 1006 133/77 -- -- 85 16 99 %   11/28/21 0906 135/86 97.8 F (36.6 C) Oral 91 18 (!) 87 %   11/28/21 0831 -- 97.8 F (36.6 C) Oral -- -- --   11/28/21 0806 121/74 -- -- 76 16 --   11/28/21 0705 118/61 -- -- 80 -- 96 %   11/28/21 0621 -- -- -- -- 16 --   11/28/21 0605 124/83 -- -- 72 -- 95 %   11/28/21 0551 -- -- -- -- 16 --   11/28/21 0505 (!) 146/76 -- -- 77 -- 99 %     Temp (24hrs), Avg:98.1 F (36.7 C), Min:97.6 F (36.4 C), Max:99.3 F (37.4 C)      Vital signs stable, afebrile.    Exam:  Patient without distress.               Abdomen soft, fundus firm at level of umbilicus, appropriately tender, slightly distention with tympany                Dressing clean, dry and intact               Lower extremities are negative for swelling, cords or tenderness, SCD's on    Labs:   Recent Results (from the past 24 hour(s))   CBC    Collection Time: 11/28/21  4:27 AM   Result Value Ref Range    WBC PLEASE DISREGARD RESULTS 3.6 - 11.0 K/uL    RBC PLEASE DISREGARD RESULTS 3.80 - 5.20 M/uL    Hemoglobin PLEASE DISREGARD RESULTS 11.5 - 16.0 g/dL    Hematocrit PLEASE DISREGARD RESULTS 35.0 - 47.0 %    MCV Cannot be calculated 80.0 - 99.0 FL    MCH Cannot be calculated 26.0 - 34.0 PG    MCHC Cannot be calculated 30.0 - 36.5 g/dL    RDW PLEASE DISREGARD RESULTS 11.5 - 14.5 %    Platelets PLEASE DISREGARD RESULTS 150 - 400 K/uL    MPV PLEASE DISREGARD RESULTS 8.9 - 12.9 FL    Nucleated RBCs PLEASE DISREGARD RESULTS 0 PER 100 WBC    nRBC PLEASE DISREGARD RESULTS 0.00 - 0.01 K/uL   CBC    Collection Time: 11/28/21  5:46 AM    Result Value Ref Range    WBC 16.3 (H) 3.6 - 11.0 K/uL    RBC 3.14 (L) 3.80 - 5.20 M/uL    Hemoglobin 9.7 (L) 11.5 - 16.0 g/dL    Hematocrit 28.8 (L) 35.0 - 47.0 %    MCV 91.7 80.0 - 99.0 FL    MCH 30.9 26.0 - 34.0 PG    MCHC 33.7 30.0 - 36.5 g/dL    RDW 13.5 11.5 - 14.5 %    Platelets 165 150 - 400 K/uL    MPV 12.0 8.9 - 12.9 FL    Nucleated RBCs 0.0 0 PER 100 WBC    nRBC 0.00 0.00 - 0.01 K/uL   Comprehensive Metabolic Panel    Collection Time: 11/28/21  5:46 AM   Result Value Ref Range    Sodium 135 (L) 136 - 145 mmol/L    Potassium 4.3 3.5 - 5.1 mmol/L    Chloride 108 97 - 108 mmol/L    CO2 24 21 - 32 mmol/L    Anion Gap 3 (L) 5 - 15 mmol/L    Glucose 96 65 - 100 mg/dL    BUN 7 6 - 20 MG/DL    Creatinine 0.73 0.55 - 1.02 MG/DL    Bun/Cre Ratio 10 (L) 12 - 20      Est, Glom Filt Rate >60 >60 ml/min/1.4m    Calcium 7.1 (L) 8.5 - 10.1 MG/DL    Total Bilirubin 0.2 0.2 - 1.0 MG/DL    ALT 17 12 - 78 U/L    AST 15 15 - 37 U/L    Alk Phosphatase 91 45 - 117 U/L    Total Protein 5.2 (L) 6.4 - 8.2 g/dL    Albumin 2.2 (L) 3.5 - 5.0 g/dL    Globulin 3.0 2.0 - 4.0 g/dL    Albumin/Globulin Ratio 0.7 (L) 1.1 - 2.2         Assessment and Plan:    Lynn Perry  a 26y.o. G1P0101 at [redacted]w[redacted]d POD#1  -meeting post op goals  -Hgb 11.6-->9.7. abdomen slightly distended and tympanic. Likely gas. Will repeat CBC.   -no pain unless changing positions  Preeclampsia with SF  -mag a 2g/hr. Timed off ~1430. Excellent UO  -BP stable without antihypertensives  Recent Bell's palsy  -s/p steroids  -on valtrex  Hypothyroidism   -on synthroid 75 mcg daily         Lynn Perry L. ShHervey ArdMD, FACherlynn June

## 2021-11-28 NOTE — Progress Notes (Signed)
Bedside and Verbal shift change report given to Fritzi Mandes, RN  (oncoming nurse) by Eileen Stanford, RN  (offgoing nurse). Report included the following information Nurse Handoff Report, Index, Adult Overview, Intake/Output, MAR, Recent Results, and Med Rec Status.

## 2021-11-29 MED ORDER — ZOLPIDEM TARTRATE 5 MG PO TABS
5 MG | Freq: Every evening | ORAL | Status: AC | PRN
Start: 2021-11-29 — End: ?
  Administered 2021-11-30: 03:00:00 5 mg via ORAL

## 2021-11-29 MED ORDER — DIPHENHYDRAMINE HCL 25 MG PO CAPS
25 MG | Freq: Four times a day (QID) | ORAL | Status: AC | PRN
Start: 2021-11-29 — End: ?
  Administered 2021-11-29: 15:00:00 25 mg via ORAL

## 2021-11-29 MED FILL — DOK 100 MG PO CAPS: 100 MG | ORAL | Qty: 1

## 2021-11-29 MED FILL — OXYCODONE HCL 5 MG PO TABS: 5 MG | ORAL | Qty: 1

## 2021-11-29 MED FILL — IBUPROFEN 400 MG PO TABS: 400 MG | ORAL | Qty: 2

## 2021-11-29 MED FILL — ACETAMINOPHEN EXTRA STRENGTH 500 MG PO TABS: 500 MG | ORAL | Qty: 2

## 2021-11-29 MED FILL — SYNTHROID 75 MCG PO TABS: 75 MCG | ORAL | Qty: 1

## 2021-11-29 MED FILL — DIPHENHIST 25 MG PO CAPS: 25 MG | ORAL | Qty: 1

## 2021-11-29 NOTE — Progress Notes (Addendum)
Bedside and Verbal shift change report given to Fritzi Mandes, RN  (oncoming nurse) by Konrad Felix, RN  (offgoing nurse). Report included the following information Nurse Handoff Report, Index, Intake/Output, MAR, Recent Results, and Med Rec Status.      2018 - in to perform assessment, pt to NICU.

## 2021-11-29 NOTE — Progress Notes (Signed)
41 Spoke with Dr.Sharp in regards to patient's rash on back. Dr.Sharp will assess patient. Ok to switch IV benadryl to oral

## 2021-11-29 NOTE — Progress Notes (Signed)
Post-Operative Day Number 2 OBGYN Progress Note    Patient doing well post-op day 2 from cesarean delivery without significant complaints.  Pain some what controlled. Has only taken motrin and tylenol. Normal lochia. Passing gas. Feels like she has a rash on her back and the sides of her breast. Hasn't slept much since admission 5 days ago. Discussed options for sleep aids.     Vitals:  Patient Vitals for the past 8 hrs:   BP Temp Temp src Pulse Resp SpO2   11/29/21 1202 137/83 -- -- 78 -- --   11/29/21 0823 138/82 99 F (37.2 C) Oral 88 20 100 %   11/29/21 0527 128/84 98.2 F (36.8 C) Oral 70 14 99 %     Temp (24hrs), Avg:98.3 F (36.8 C), Min:97.7 F (36.5 C), Max:99 F (37.2 C)      Vital signs stable, afebrile.    Exam:  Patient without distress.               Abdomen soft, fundus firm at level of umbilicus, appropriately tender, slightly distention with tympany                Dressing clean, dry and intact               Lower extremities are negative for swelling, cords or tenderness, SCD's on    Skin: non specific red raised rash on her upper back and the side of her left breast     Labs:   Recent Results (from the past 24 hour(s))   CBC    Collection Time: 11/28/21 12:54 PM   Result Value Ref Range    WBC 14.3 (H) 3.6 - 11.0 K/uL    RBC 3.44 (L) 3.80 - 5.20 M/uL    Hemoglobin 10.6 (L) 11.5 - 16.0 g/dL    Hematocrit 28.4 (L) 35.0 - 47.0 %    MCV 92.2 80.0 - 99.0 FL    MCH 30.8 26.0 - 34.0 PG    MCHC 33.4 30.0 - 36.5 g/dL    RDW 13.2 44.0 - 10.2 %    Platelets 183 150 - 400 K/uL    MPV 12.0 8.9 - 12.9 FL    Nucleated RBCs 0.0 0 PER 100 WBC    nRBC 0.00 0.00 - 0.01 K/uL       Assessment and Plan:    Lynn Perry  a 26 y.o. G1P0101 at [redacted]w[redacted]d for NRFHT's in setting of IOL for preeclampsia with SF  POD#2  -meeting post op goals  -Hgb 11.6-->9.7-->10.6 VSS  -pain well controlled since adding Roxicodone   Preeclampsia with SF  -BP stable without antihypertensives  -denies symptoms   Recent Bell's palsy  -s/p  steroids and valtrex   Hypothyroidism   -on synthroid 75 mcg daily   Nonspecific rash  -Benadryl PRN  Insomnia   -discussed options for sleep aids including R/B while breast feeding      Dispo: likely discharge tomorrow if no acute changes           Lynn Sortor L. Lambert Mody, MD, Evern Core

## 2021-11-29 NOTE — Anesthesia Post-Procedure Evaluation (Signed)
Department of Anesthesiology  Postprocedure Note    Patient: Lynn Perry  MRN: [id number]  Birthdate: 13-May-1995  Date of evaluation: 11/29/2021      Procedure Summary     Date: 11/27/21 Room / Location: [facility] L&D OR    Anesthesia Start: 1351 Anesthesia Stop: 1520    Procedure: CESAREAN SECTION (Abdomen/Perineum) Diagnosis:       (Failure to Progress)      (Fetal Intolerance of Labor)    Surgeons: Rachael Darby, MD Responsible Provider: Lillie Columbia, MD    Anesthesia Type: Spinal ASA Status: 2          Anesthesia Type: Spinal    Aldrete Phase I:      Aldrete Phase II:        Anesthesia Post Evaluation    Patient location during evaluation: PACU  Patient participation: complete - patient participated  Level of consciousness: awake  Airway patency: patent  Nausea & Vomiting: no nausea  Complications: no  Cardiovascular status: blood pressure returned to baseline and hemodynamically stable  Respiratory status: acceptable  Hydration status: stable  Multimodal analgesia pain management approach

## 2021-11-29 NOTE — Lactation Note (Signed)
This note was copied from a baby's chart.  CLC rounded on mother with concern for Engorgement.  CLC performed a breast assessment and educated mother on the principles of Engorgement.   Mother educated on integrating hand massage into her pre- feeding or pumping practice as well as hand expression.  CLC performed counter pressure exercises to soften the areola and had mother perform counter pressure as well.  Mother educated on applying ice to both breasts after nursing or pumping for 10-15 minutes.  Mother encouraged to hand express after pumping sessions and utilize a pumping bra for support and to incorporate hands on pumping.

## 2021-11-30 MED ORDER — IBUPROFEN 800 MG PO TABS
800 MG | ORAL_TABLET | Freq: Three times a day (TID) | ORAL | 3 refills | Status: AC
Start: 2021-11-30 — End: 2022-01-09
  Filled 2021-11-30: qty 90, 30d supply, fill #0

## 2021-11-30 MED ORDER — ZOLPIDEM TARTRATE 5 MG PO TABS
5 MG | ORAL_TABLET | Freq: Every evening | ORAL | 0 refills | Status: AC | PRN
Start: 2021-11-30 — End: 2021-12-14
  Filled 2021-11-30: qty 14, 14d supply, fill #0

## 2021-11-30 MED ORDER — DSS 100 MG PO CAPS
100 MG | ORAL_CAPSULE | Freq: Two times a day (BID) | ORAL | 0 refills | Status: DC
Start: 2021-11-30 — End: 2022-01-09

## 2021-11-30 MED ORDER — ACETAMINOPHEN 500 MG PO TABS
500 MG | ORAL_TABLET | Freq: Three times a day (TID) | ORAL | 3 refills | Status: AC
Start: 2021-11-30 — End: 2022-01-09
  Filled 2021-11-30: qty 120, 30d supply, fill #0

## 2021-11-30 MED ORDER — OXYCODONE HCL 5 MG PO TABS
5 MG | ORAL_TABLET | ORAL | 0 refills | Status: AC | PRN
Start: 2021-11-30 — End: 2021-12-03
  Filled 2021-11-30: qty 12, 2d supply, fill #0

## 2021-11-30 MED FILL — DOCUSATE SODIUM 100MG CAPS: 100 100 MG | 30 days supply | Qty: 60 | Fill #0 | Status: AC

## 2021-11-30 MED FILL — OXYCODONE HCL 5 MG PO TABS: 5 MG | ORAL | Qty: 1

## 2021-11-30 MED FILL — DOK 100 MG PO CAPS: 100 MG | ORAL | Qty: 1

## 2021-11-30 MED FILL — ACETAMINOPHEN EXTRA STRENGTH 500 MG PO TABS: 500 MG | ORAL | Qty: 2

## 2021-11-30 MED FILL — IBUPROFEN 400 MG PO TABS: 400 MG | ORAL | Qty: 2

## 2021-11-30 MED FILL — ZOLPIDEM TARTRATE 5 MG PO TABS: 5 MG | ORAL | Qty: 1

## 2021-11-30 MED FILL — SYNTHROID 75 MCG PO TABS: 75 MCG | ORAL | Qty: 1

## 2021-11-30 NOTE — Discharge Summary (Signed)
Obstetrical Discharge Summary     Name: Lynn Perry MRN: [id number]  SSN: [id number]    Date of Birth: May 06, 1995  Age: 26 y.o.  Sex: female      Admit Date: 11/25/2021    Discharge Date: 11/30/2021     Admitting Physician: Lilian Kapur, MD     Attending Physician: Lilian Kapur, MD     Admission Diagnoses: Non-reassuring electronic fetal monitoring tracing [phone]  Preeclampsia, third trimester [O14.93]    Discharge Diagnoses:   Information for the patient's newborn:  [place], [place] [[id number]]   @[id number] @      Additional Diagnoses:  No components found for: "OBEXTABORH", "OBEXTABSCRN", "OBEXTRUBELLA", "[facility] Course: Admitted with concerns for PTL and decelerations on FHT monitoring. She subsequently developed preeclampsia with severe features. Her fetus did not tolerate induction of labor and she underwent a cesarean section at [redacted]w[redacted]d Her post operative course was uncomplicated.      Disposition at Discharge: Home or self care    Discharged Condition: Stable    Patient Instructions:   Discharge Medication List as of 11/30/2021  4:07 PM        START taking these medications    Details   oxyCODONE (ROXICODONE) 5 MG immediate release tablet Take 1 tablet by mouth every 4 hours as needed for Pain for up to 3 days. Max Daily Amount: 30 mg, Disp-12 tablet, R-0Normal      acetaminophen (TYLENOL) 500 MG tablet Take 2 tablets by mouth every 8 hours, Disp-120 tablet, R-3Normal      ibuprofen (ADVIL;MOTRIN) 800 MG tablet Take 1 tablet by mouth every 8 (eight) hours, Disp-120 tablet, R-3Normal      zolpidem (AMBIEN) 5 MG tablet Take 1 tablet by mouth nightly as needed for Sleep for up to 14 days. Max Daily Amount: 5 mg, Disp-14 tablet, R-0Normal      docusate sodium (COLACE, DULCOLAX) 100 MG CAPS Take 100 mg by mouth 2 times daily, Disp-60 capsule, R-0Normal           CONTINUE these medications which have NOT CHANGED    Details   levothyroxine (SYNTHROID) 75 MCG tablet Take 1 tablet by mouth daily,  Disp-90 tablet, R-1Normal      Prenatal Vit-Fe Fumarate-FA (PRENATAL VITAMIN) 27-1 MG TABS tablet Take 1 tablet by mouth dailyHistorical Med      Blood Pressure Monitoring (BLOOD PRESSURE KIT) DEVI 1 dose pack by Does not apply route dailyHistorical Med      Misc. Devices (GOJJI WEIGHT SCALE) MISC 1 Dose by Does not apply route dailyHistorical Med           STOP taking these medications       valACYclovir (VALTREX) 1 g tablet Comments:   Reason for Stopping:         predniSONE (DELTASONE) 20 MG tablet Comments:   Reason for Stopping:         cyclobenzaprine (FLEXERIL) 5 MG tablet Comments:   Reason for Stopping:         famotidine (PEPCID) 20 MG tablet Comments:   Reason for Stopping:               Reference my discharge instructions.    Incision check on Tuesday with Dr. PAnnamary Carolin    Signed By:  SLilian Kapur MD     November 30, 2021

## 2021-11-30 NOTE — Progress Notes (Addendum)
Spoke with case management Crystal. Crystal will speak to NICU nurse in regards to setting a bed for patient to stay in overnight.     Crystal will set up breast pump for home for patient    1337 Notified Dr.Sharp of patient's EPDS score

## 2021-11-30 NOTE — Progress Notes (Signed)
Discharge education provided to patient

## 2021-12-01 ENCOUNTER — Inpatient Hospital Stay
Admit: 2021-12-01 | Discharge: 2021-12-01 | Disposition: A | Discharge status: Home / Self Care | Payer: BLUE CROSS/BLUE SHIELD

## 2021-12-01 DIAGNOSIS — O1495 Unspecified pre-eclampsia, complicating the puerperium: Secondary | ICD-10-CM

## 2021-12-01 LAB — CBC
Hematocrit: 30.7 % — ABNORMAL LOW (ref 35.0–47.0)
Hemoglobin: 10.1 g/dL — ABNORMAL LOW (ref 11.5–16.0)
MCH: 30.7 PG (ref 26.0–34.0)
MCHC: 32.9 g/dL (ref 30.0–36.5)
MCV: 93.3 FL (ref 80.0–99.0)
MPV: 11.4 FL (ref 8.9–12.9)
Nucleated RBCs: 0 PER 100 WBC
Platelets: 197 10*3/uL (ref 150–400)
RBC: 3.29 M/uL — ABNORMAL LOW (ref 3.80–5.20)
RDW: 13.7 % (ref 11.5–14.5)
WBC: 11.9 10*3/uL — ABNORMAL HIGH (ref 3.6–11.0)
nRBC: 0 10*3/uL (ref 0.00–0.01)

## 2021-12-01 LAB — COMPREHENSIVE METABOLIC PANEL
ALT: 121 U/L — ABNORMAL HIGH (ref 12–78)
AST: 122 U/L — ABNORMAL HIGH (ref 15–37)
Albumin/Globulin Ratio: 0.9 — ABNORMAL LOW (ref 1.1–2.2)
Albumin: 2.8 g/dL — ABNORMAL LOW (ref 3.5–5.0)
Alk Phosphatase: 94 U/L (ref 45–117)
Anion Gap: 3 mmol/L — ABNORMAL LOW (ref 5–15)
BUN: 12 MG/DL (ref 6–20)
Bun/Cre Ratio: 16 (ref 12–20)
CO2: 25 mmol/L (ref 21–32)
Calcium: 8.9 MG/DL (ref 8.5–10.1)
Chloride: 109 mmol/L — ABNORMAL HIGH (ref 97–108)
Creatinine: 0.76 MG/DL (ref 0.55–1.02)
Est, Glom Filt Rate: >60 mL/min/{1.73_m2} (ref >60)
Globulin: 3.1 g/dL (ref 2.0–4.0)
Glucose: 75 mg/dL (ref 65–100)
Potassium: 4.4 mmol/L (ref 3.5–5.1)
Sodium: 137 mmol/L (ref 136–145)
Total Bilirubin: 0.4 MG/DL (ref 0.2–1.0)
Total Protein: 5.9 g/dL — ABNORMAL LOW (ref 6.4–8.2)

## 2021-12-01 MED ORDER — NIFEDIPINE ER OSMOTIC RELEASE 30 MG PO TB24
30 MG | ORAL_TABLET | Freq: Every day | ORAL | 3 refills | Status: AC
Start: 2021-12-01 — End: ?
  Filled 2021-12-01: qty 30, 30d supply, fill #0

## 2021-12-01 MED ORDER — LIDOCAINE HCL URETHRAL/MUCOSAL 2 % EX PRSY
2 % | CUTANEOUS | Status: DC | PRN
Start: 2021-12-01 — End: 2021-12-01

## 2021-12-01 MED FILL — GLYDO 2 % EX PRSY: 2 % | CUTANEOUS | Qty: 6

## 2021-12-01 NOTE — Other (Signed)
Severe Bps with mild swelling but no other complications.

## 2021-12-01 NOTE — Telephone Encounter (Signed)
Patient called in, name and DOB verified. Patient is currently c/o higher BP's 151/109 and 151/101. Pt just discharged from hospital yesterday and was told to monitor Bp's at home. Not sent home on any BP meds. Pt denies any other symptoms other than lower extremity swelling. She was immediately sent to [facility] and I have called them to inform them of her arrival. Pt is currently 15-20 mins away and will be leaving her house ASAP. Just an FYI. Sharp pt.

## 2021-12-01 NOTE — Progress Notes (Addendum)
1055~  SBAR report from [place]    1100~  Dr Araceli Bouche in to assess patient.  Labs sent.    1200~  Bedside and Verbal shift change report given to [place] (oncoming nurse) by Damaris Schooner RN (offgoing nurse). Report included the following information Nurse Handoff Report.

## 2021-12-01 NOTE — ED Provider Notes (Signed)
Analyce is a pleasant 26 yo G1P0101 who is POD 4 s/p PLTCS @ 34 weeks for preE with SF and fetal intolerance.  Pt was discharged yesterday and did not require BP meds at discharge, but was advised to monitor her BP at home.  She reports that this AM she was getting BP readings in the range of 150s/101-109.  She is asymptomatic.  She denies any HA, visual changes, CP, SOB, abdominal pain.  She says she "feels fine" except her legs are a little swollen.  She is having some incisional pain but is managing that well with Roxi and tylenol.  She is producing lots of breastmilk and reports that her little boy is doing well in the NICU.    She was told to notify the office if BP was over 150/90 and when she called them, she was advised to present to [facility].           No chief complaint on file.      Past Medical History:   Diagnosis Date   . Hypothyroidism    . Major depressive disorder    . Preeclampsia, third trimester 11/27/2021   . Psychogenic nonepileptic seizure    . Rubella non-immune status, antepartum    . Trauma        Past Surgical History:   Procedure Laterality Date   . CESAREAN SECTION N/A 11/27/2021    CESAREAN SECTION performed by Rachael Darby, MD at [facility] L&D OR   . LYMPH NODE DISSECTION     . TONSILLECTOMY Bilateral 2006         Family History   Problem Relation Age of Onset   . Lupus Mother         +Diverticulitis   . Hypertension Father        Social History     Socioeconomic History   . Marital status: Single     Spouse name: Not on file   . Number of children: Not on file   . Years of education: Not on file   . Highest education level: Not on file   Occupational History   . Not on file   Tobacco Use   . Smoking status: Never   . Smokeless tobacco: Never   Vaping Use   . Vaping Use: Never used   Substance and Sexual Activity   . Alcohol use: Not Currently   . Drug use: Not on file   . Sexual activity: Yes     Partners: Male     Birth control/protection: None   Other Topics Concern   . Not on file   Social  History Narrative   . Not on file     Social Determinants of Health     Financial Resource Strain: Not on file   Food Insecurity: Not on file   Transportation Needs: Not on file   Physical Activity: Not on file   Stress: Not on file   Social Connections: Not on file   Intimate Partner Violence: Not on file   Housing Stability: Not on file         ALLERGIES: Contrast [iodides]    Review of Systems   All other systems reviewed and are negative.      Vitals:    12/01/21 1035 12/01/21 1037 12/01/21 1048 12/01/21 1105   BP: (!) 172/99 (!) 172/99 (!) 145/95 (!) 141/92   Pulse: 81 85 83 87   Resp: 16      Temp: 97.8 F (  36.6 C)      TempSrc: Oral      SpO2: 99%               Physical Exam  Vitals and nursing note reviewed. Exam conducted with a chaperone present.   HENT:      Head: Normocephalic.   Eyes:      Extraocular Movements: Extraocular movements intact.   Cardiovascular:      Rate and Rhythm: Normal rate and regular rhythm.   Pulmonary:      Effort: Pulmonary effort is normal.      Breath sounds: Normal breath sounds.   Abdominal:      General: Abdomen is flat.      Palpations: Abdomen is soft.      Comments: Incision:  bandage removed; healing well; C/D/I   Musculoskeletal:         General: Normal range of motion.      Cervical back: Normal range of motion.   Skin:     General: Skin is warm and dry.   Neurological:      General: No focal deficit present.      Mental Status: She is oriented to person, place, and time.      Deep Tendon Reflexes: Reflexes normal.      Comments: No clonus  Bilateral 1+ LE edema   Psychiatric:         Mood and Affect: Mood normal.         Behavior: Behavior normal.          MDM  Number of Diagnoses or Management Options  Diagnosis management comments:   CLINICAL IMPRESSION:  1.  Postpartum preE wo SF       Amount and/or Complexity of Data Reviewed  Clinical lab tests: ordered  Review and summarize past medical records: yes    Risk of Complications, Morbidity, and/or  Mortality  Presenting problems: moderate  Diagnostic procedures: moderate  Management options: moderate    Patient Progress  Patient progress: stable            ED Course as of 12/01/21 1206   Fri Dec 01, 2021   1058 Admit to [facility]  Serial labs and BP checks.   [DG]   9702 LFTs are elevated, but remainder labs are ok and BP are mild range  Pt has been taking tylenol which may be contributing to her elevated LFTs  Will discharge with Rx for Procardia 30 XL and advise to alternate Roxi with Motrin instead of Tylenol and to have repeat labs at beginning of next week  Strict preE precautions given [DG]      ED Course User Index  [DG] Nadara Mode, MD       Procedures

## 2021-12-01 NOTE — Discharge Instructions (Signed)
Learning About Preeclampsia After Childbirth  What is preeclampsia?    Preeclampsia is high blood pressure and signs of organ damage, such as protein in the urine, usually after 20 weeks of pregnancy. If it's not treated, preeclampsia can harm you or your baby.  Severe preeclampsia can lead to dangerous seizures (eclampsia). When preeclampsia affects the liver, it can cause HELLP syndrome, a blood-clotting and bleeding problem. HELLP can come on quickly and can be dangerous. This is why your doctor checks you and your baby often.  Preeclampsia usually goes away after the baby is born. But symptoms may last or get worse after delivery. In rare cases, symptoms may not show up until days or even weeks after childbirth.  What are the symptoms?  Mild preeclampsia usually doesn't cause symptoms. But it may cause rapid weight gain and sudden swelling of the hands and face. Severe preeclampsia can cause symptoms such as a severe headache, vision problems, and trouble breathing. It also can cause belly pain. And you may urinate less than usual.  What can you expect after you've had preeclampsia?  In the hospital  After the baby and the placenta are delivered, preeclampsia usually starts to get better. Most people get better in the first few days after childbirth.  After having preeclampsia, you still have a risk of seizures for a day or more after childbirth. (In very rare cases, seizures happen later on.) So your doctor may have you take magnesium sulfate for a day or more to prevent seizures. You may also take medicine to lower your blood pressure.  When you go home  Your blood pressure will most likely return to normal a few days after delivery. Your doctor will want to check your blood pressure sometime in the first week after you leave the hospital.  High blood pressure sometimes continues after childbirth. But it usually returns to normal levels with time.  Take and record your blood pressure at home if your  doctor tells you to.  Ask your doctor to check your blood pressure monitor to be sure that it is accurate and that the cuff fits you. Also ask your doctor to watch you use it, to make sure that you are using it right.  Don't eat, use tobacco products, or use medicine known to raise blood pressure (such as some nasal decongestant sprays) before you take your blood pressure.  Avoid taking your blood pressure if you have just exercised or if you're nervous or upset. Rest at least 15 minutes before you take your blood pressure.  Take your medicines exactly as prescribed. Call your doctor if you think you are having a problem with your medicine.  If you smoke, quit or cut back as much as you can. Smoking and vaping can be harmful to your baby. Talk to your doctor if you need help quitting.  Eat a variety of healthy foods. Include plenty of foods high in calcium, such as dairy products, almonds, and dark leafy greens.  Long-term health  After you've had preeclampsia, you have an increased risk of high blood pressure, heart disease, stroke, and kidney disease. This may be because the same things that cause preeclampsia also cause heart and kidney disease.  To protect your health, work with your doctor on living a heart-healthy lifestyle and getting the checkups you need. Your doctor may also want you to check your blood pressure at home.  Follow-up care is a key part of your treatment and safety. Be sure to make  and go to all appointments, and call your doctor if you are having problems. It's also a good idea to know your test results and keep a list of the medicines you take.  When should you call for help?  Share this information with your partner or a friend. They can help you watch for warning signs.  Call 911  anytime you think you may need emergency care. For example, call if:    You passed out (lost consciousness).     You have a seizure.     You have trouble breathing.     You have chest pain.   Call your doctor  now or seek immediate medical care if:    You have symptoms of preeclampsia, such as:  Sudden swelling of your face, hands, or feet.  New vision problems (such as dimness, blurring, or seeing spots).  A severe headache.     Your blood pressure is very high, such as 160/110 or higher.     Your blood pressure is higher than your doctor told you it should be, or it rises quickly.     You have new nausea or vomiting.     You have pain in your belly or pelvis.     You gain weight rapidly.   Where can you learn more?  Go to [employer] and enter Q718 to learn more about "Learning About Preeclampsia After Childbirth."  Current as of: September 26, 2021               Content Version: 13.8   2006-2023 [employer].   Care instructions adapted under license by [facility]. If you have questions about a medical condition or this instruction, always ask your healthcare professional. [employer] disclaims any warranty or liability for your use of this information.

## 2021-12-04 ENCOUNTER — Ambulatory Visit: Admit: 2021-12-04 | Payer: BLUE CROSS/BLUE SHIELD | Attending: Obstetrics & Gynecology

## 2021-12-04 DIAGNOSIS — R7989 Other specified abnormal findings of blood chemistry: Secondary | ICD-10-CM

## 2021-12-04 NOTE — Progress Notes (Signed)
BP check, Wound check    Lynn Perry is a 26 y.o. female who presents for Blood pressure and incision check.     POD 7 s/p PLTCS @ 34 weeks for preE with SF and fetal intolerance of labor.     Seen in [place] 12/01/21 for elevated blood pressures and swelling.  Started on Procardia XL 30 mg.  LFT's elevated; was told Tylenol could be contributing.  Has been alternating btwn Roxi and Ibuprofen. Some constipation.    'Lynn Perry' doing well in NICU.    Patient pumping- doing well    Mood is stable, occasionally tearful, but overall doing well.      BP 120/80   Wt 153 lb 12.8 oz (69.8 kg)   Breastfeeding Yes Comment: pumping  BMI 26.40 kg/m     PHYSICAL EXAMINATION    Constitutional  Appearance: well-nourished, well developed, alert, in no acute distress    HENT  Head and Face: appears normal    Gastrointestinal  Abdominal Examination: abdomen non-tender to palpation, normal bowel sounds, no masses present, +pfannenstiel incision healing well, steri-strips removed, slight bruising above incision  Liver and spleen: no hepatomegaly present, spleen not palpable  Hernias: no hernias identified    Neurologic/Psychiatric  Mental Status:  Orientation: grossly oriented to person, place and time  Mood and Affect: mood normal, affect appropriate    Assessment:  BP check  Wound check  Elevated transaminases  Constipation    Plan:  -continue procardia XL 30mg   -continue home BP surveillance  -repeat preE labs today  -preE precautions reviewed  -continue avoiding tylenol  -ibuprofen prn pain  -advised weaning of roxicodone due to constipation  -bowel regimen, stool softeners, etc  -reassurance that wound healing well, steri strips removed today    RTC for routine 6 wk postpartum visit, or sooner prn    Sim Boast, MD  12/04/2021  10:16 AM

## 2021-12-04 NOTE — Telephone Encounter (Signed)
Patient called in, name and DOB verified. Patient is calling in to inquire if she will need a cream or ointment for her incision. Patient was just seen in the office today and forgot to ask. Patient was advised that she will need to try to keep that area dry with no creams. To clean gently wash with warm water and mild soap. Patient expressed understanding. This advice was given after consult an RN Joellen Jersey S.) in the office.

## 2021-12-05 ENCOUNTER — Ambulatory Visit: Payer: BLUE CROSS/BLUE SHIELD | Attending: Obstetrics & Gynecology

## 2021-12-05 LAB — CBC
Hematocrit: 34.6 % (ref 34.0–46.6)
Hemoglobin: 11.7 g/dL (ref 11.1–15.9)
MCH: 31 pg (ref 26.6–33.0)
MCHC: 33.8 g/dL (ref 31.5–35.7)
MCV: 92 fL (ref 79–97)
Platelets: 258 10*3/uL (ref 150–450)
RBC: 3.77 x10E6/uL (ref 3.77–5.28)
RDW: 13.4 % (ref 11.7–15.4)
WBC: 10.7 10*3/uL (ref 3.4–10.8)

## 2021-12-05 LAB — COMPREHENSIVE METABOLIC PANEL
ALT: 49 IU/L — ABNORMAL HIGH (ref 0–32)
AST: 25 IU/L (ref 0–40)
Albumin/Globulin Ratio: 1.6 (ref 1.2–2.2)
Albumin: 4 g/dL (ref 4.0–5.0)
Alkaline Phosphatase: 100 IU/L (ref 44–121)
BUN/Creatinine Ratio: 26 — ABNORMAL HIGH (ref 9–23)
BUN: 18 mg/dL (ref 6–20)
CO2: 21 mmol/L (ref 20–29)
Calcium: 9.3 mg/dL (ref 8.7–10.2)
Chloride: 108 mmol/L — ABNORMAL HIGH (ref 96–106)
Creatinine: 0.68 mg/dL (ref 0.57–1.00)
Est, Glomerular Filtration Rate: 125 mL/min/{1.73_m2} (ref >59)
Globulin, Total: 2.5 g/dL (ref 1.5–4.5)
Glucose: 59 mg/dL — ABNORMAL LOW (ref 70–99)
Potassium: 4.9 mmol/L (ref 3.5–5.2)
Sodium: 142 mmol/L (ref 134–144)
Total Bilirubin: 0.3 mg/dL (ref 0.0–1.2)
Total Protein: 6.5 g/dL (ref 6.0–8.5)

## 2022-01-09 ENCOUNTER — Other Ambulatory Visit: Admit: 2022-01-09 | Payer: MEDICAID | Attending: Obstetrics & Gynecology

## 2022-01-09 NOTE — Progress Notes (Signed)
Postpartum evaluation    Lynn Perry is a 26 y.o. female who presents for a postpartum exam.     S/p  PLTCS @ 34 weeks for preE with SF and fetal intolerance of labor.    Baby '[name]' still in NICU.  Unsure when he will be able to go home.    Concerns: PPD, blaming herself    Mood stable, denies anxiety. Symptoms of PPD, EDS 17.    Incision healing well.     Bleeding/lochia appropriate. Has not yet had menses.    Patient reports she is no longer pumping but has a good stash of breast milk!    The patient opts to use condoms for contraception.     She is currently taking the following medications: Synthroid 75 mcg and Procardia 30 mg    Last pap: approx 1 year ago normal per patient; denies hx of abnormal paps in the past      BP 110/70   Wt 69.3 kg (152 lb 12.8 oz)   Breastfeeding No   BMI 26.23 kg/m     PHYSICAL EXAMINATION    Constitutional  Appearance: well-nourished, well developed, alert, in no acute distress    HENT  Head and Face: appears normal    Gastrointestinal  Abdominal Examination: abdomen non-tender to palpation, normal bowel sounds, no masses present  Liver and spleen: no hepatomegaly present, spleen not palpable  Hernias: no hernias identified  Incision: CDI    Neurologic/Psychiatric  Mental Status:  Orientation: grossly oriented to person, place and time  Mood and Affect: mood normal, affect appropriate    Assessment:  Normal postpartum check  PPD  -declines counseling or meds. Good support system. No SI or HI    Plan:  Next pap due next year     Return 4-8 weeks.

## 2022-03-06 ENCOUNTER — Ambulatory Visit: Admit: 2022-03-06 | Discharge: 2022-03-06 | Payer: MEDICAID | Attending: Obstetrics & Gynecology

## 2022-03-06 DIAGNOSIS — N898 Other specified noninflammatory disorders of vagina: Secondary | ICD-10-CM

## 2022-03-06 MED ORDER — BUSPIRONE HCL 5 MG PO TABS
5 MG | ORAL_TABLET | Freq: Three times a day (TID) | ORAL | 0 refills | Status: AC
Start: 2022-03-06 — End: 2022-04-05

## 2022-03-06 MED ORDER — BUPROPION HCL ER (XL) 150 MG PO TB24
150 MG | ORAL_TABLET | Freq: Every morning | ORAL | 3 refills | Status: DC
Start: 2022-03-06 — End: 2022-03-27

## 2022-03-06 NOTE — Progress Notes (Signed)
Problem Visit    Lynn Perry is a 26 y.o. presenting for problem visit. Her main concern today is anger and rage since delivering.  She is also experiencing severe vaginal dryness and irritation especially during intercourse.        Ob/Gyn Hx:  G1P0101   LMP- postpartum  Menses- postpartum  Contraception- none  SA- yes, female partner        Past Medical History:   Diagnosis Date    Hypothyroidism     Major depressive disorder     Preeclampsia, third trimester 11/27/2021    Psychogenic nonepileptic seizure     Rubella non-immune status, antepartum     Trauma        Past Surgical History:   Procedure Laterality Date    CESAREAN SECTION N/A 11/27/2021    CESAREAN SECTION performed by Lilian Kapur, MD at [facility] L&D OR    LYMPH NODE DISSECTION      TONSILLECTOMY Bilateral 2006       Family History   Problem Relation Age of Onset    Lupus Mother         +Diverticulitis    Hypertension Father        Social History     Socioeconomic History    Marital status: Single     Spouse name: Not on file    Number of children: Not on file    Years of education: Not on file    Highest education level: Not on file   Occupational History    Not on file   Tobacco Use    Smoking status: Never    Smokeless tobacco: Never   Vaping Use    Vaping Use: Never used   Substance and Sexual Activity    Alcohol use: Not Currently    Drug use: Not on file    Sexual activity: Yes     Partners: Male     Birth control/protection: None   Other Topics Concern    Not on file   Social History Narrative    Not on file     Social Determinants of Health     Financial Resource Strain: Not on file   Food Insecurity: Not on file   Transportation Needs: Not on file   Physical Activity: Not on file   Stress: Not on file   Social Connections: Not on file   Intimate Partner Violence: Not on file   Housing Stability: Not on file       Current Outpatient Medications   Medication Sig Dispense Refill    NIFEdipine (PROCARDIA XL) 30 MG extended release tablet Take 1 tablet by  mouth daily 30 tablet 3    levothyroxine (SYNTHROID) 75 MCG tablet Take 1 tablet by mouth daily 90 tablet 1    Prenatal Vit-Fe Fumarate-FA (PRENATAL VITAMIN) 27-1 MG TABS tablet Take 1 tablet by mouth daily (Patient not taking: Reported on 01/09/2022)      Blood Pressure Monitoring (BLOOD PRESSURE KIT) DEVI 1 dose pack by Does not apply route daily (Patient not taking: Reported on 01/09/2022)      Misc. Devices (GOJJI WEIGHT SCALE) MISC 1 Dose by Does not apply route daily (Patient not taking: Reported on 01/09/2022)       No current facility-administered medications for this visit.       Allergies   Allergen Reactions    Contrast [Iodides]        Review of Systems - History obtained from the patient, Review of  System is negative except as otherwise noted in the HPI      Physical Exam    BP 122/78 (Site: Right Upper Arm, Position: Sitting)   Wt 68.5 kg (151 lb)   BMI 25.92 kg/m     OBGyn Exam    Constitutional  Appearance: well-nourished, well developed, alert, in no acute distress    HENT  Head and Face: appears normal    Chest  Respiratory Effort: non-labored breathing    Cardiovascular  Heart:  Extremities: no peripheral edema  Skin  General Inspection: no rash, no lesions identified    Neurologic/Psychiatric  Mental Status:  Orientation: grossly oriented to person, place and time  Mood and Affect: mood normal, affect appropriate      Assessment/Plan:  Lynn Perry IS A 26 y.o. G1P0101   1. Vaginal dryness  -We discussed her vaginal dryness in detail.  Patient recently stopped breast-feeding.  I discussed that this is likely related to breast-feeding.  I told her this will improve over time.  She is instructed to use lubricants.    2. Postpartum depression  -feeling a lot of rage. No HI or SI.  Declines counseling services at this time.  Has tried Zoloft and Lexapro in the past and did not do well.  She is interested in medication intervention.  We discussed the risk, benefits, indications, alternatives, side  effects and possible complications BuSpar and Lexapro. Patient voiced understanding. All questions answered.    30+ minutes spent with the patient including face to face interaction and chart review.       Patient will return to office   Return in about 3 weeks (around 03/27/2022).          Please note that portions of this note was potentially completed with Dragon dictation, the computer voice recognition software.  Quite often unanticipated grammatical, syntax, homophones, and other interpretive errors are inadvertently transcribed by the computer software.  Please disregard these errors.  Please excuse any errors that have escaped final proofreading.  Thank you.        Romana Deaton L. Hervey Ard, MD

## 2022-03-06 NOTE — Progress Notes (Deleted)
Postpartum Visit    Lynn Perry is a 26 y.o.  presenting for her postpartum visit.  She delivered on 11/27/2021 by [facility] and delivery was complicated by preE with SF and fetal intolerance of labor.      Seen in [place] 12/01/21 for elevated blood pressures and swelling.  Started on Procardia XL 30 mg.  LFT's elevated; was told Tylenol could be contributing.  Has been alternating btwn Roxi and Ibuprofen. Some constipation.     'Lynn Perry' doing well in NICU.     Patient pumping- doing well     Mood is stable, occasionally tearful, but overall doing well.  She has been doing well since discharged from the hospital with a normal postpartum course.    Bleeding -   Perineal/Incisional pain -   Mood -  Feeding -   Contraception -         Past Medical History:   Diagnosis Date    Hypothyroidism     Major depressive disorder     Preeclampsia, third trimester 11/27/2021    Psychogenic nonepileptic seizure     Rubella non-immune status, antepartum     Trauma        Past Surgical History:   Procedure Laterality Date    CESAREAN SECTION N/A 11/27/2021    CESAREAN SECTION performed by Lynn Kapur, MD at [facility] L&D OR    LYMPH NODE DISSECTION      TONSILLECTOMY Bilateral 2006       Family History   Problem Relation Age of Onset    Lupus Mother         +Diverticulitis    Hypertension Father        Social History     Socioeconomic History    Marital status: Single     Spouse name: Not on file    Number of children: Not on file    Years of education: Not on file    Highest education level: Not on file   Occupational History    Not on file   Tobacco Use    Smoking status: Never    Smokeless tobacco: Never   Vaping Use    Vaping Use: Never used   Substance and Sexual Activity    Alcohol use: Not Currently    Drug use: Not on file    Sexual activity: Yes     Partners: Male     Birth control/protection: None   Other Topics Concern    Not on file   Social History Narrative    Not on file     Social Determinants of Health     Financial  Resource Strain: Not on file   Food Insecurity: Not on file   Transportation Needs: Not on file   Physical Activity: Not on file   Stress: Not on file   Social Connections: Not on file   Intimate Partner Violence: Not on file   Housing Stability: Not on file       Current Outpatient Medications   Medication Sig Dispense Refill    NIFEdipine (PROCARDIA XL) 30 MG extended release tablet Take 1 tablet by mouth daily 30 tablet 3    levothyroxine (SYNTHROID) 75 MCG tablet Take 1 tablet by mouth daily 90 tablet 1    Prenatal Vit-Fe Fumarate-FA (PRENATAL VITAMIN) 27-1 MG TABS tablet Take 1 tablet by mouth daily (Patient not taking: Reported on 01/09/2022)      Blood Pressure Monitoring (BLOOD PRESSURE KIT) DEVI 1 dose pack by  Does not apply route daily (Patient not taking: Reported on 01/09/2022)      Misc. Devices (GOJJI WEIGHT SCALE) MISC 1 Dose by Does not apply route daily (Patient not taking: Reported on 01/09/2022)       No current facility-administered medications for this visit.       Allergies   Allergen Reactions    Contrast [Iodides]        Review of Systems - History obtained from the patient  Constitutional: negative for weight loss, fever, night sweats  HEENT: negative for hearing loss, earache, congestion, snoring, sorethroat  CV: negative for chest pain, palpitations, edema  Resp: negative for cough, shortness of breath, wheezing  GI: negative for change in bowel habits, abdominal pain, black or bloody stools  GU: negative for frequency, dysuria, hematuria, vaginal discharge  MSK: negative for back pain, joint pain, muscle pain  Breast: negative for breast lumps, nipple discharge, galactorrhea  Skin :negative for itching, rash, hives  Neuro: negative for dizziness, headache, confusion, weakness  Psych: negative for anxiety, depression, change in mood  Heme/lymph: negative for bleeding, bruising, pallor    Physical Exam    There were no vitals taken for this visit.      OBGyn  Exam      Constitutional  Appearance: well-nourished, well developed, alert, in no acute distress    HENT  Head and Face: appears normal    Neck  Inspection/Palpation: normal appearance, no masses or tenderness  Thyroid: gland size normal, nontender    Chest  Respiratory Effort: non-labored breathing    Cardiovascular  Extremities: no peripheral edema    Gastrointestinal  Abdominal Examination: abdomen non-distended, non-tender to palpation, no masses present  Liver and spleen: no hepatomegaly present, spleen not palpable  Hernias: no hernias identified    Genitourinary  External Genitalia: normal appearance for age, no discharge present, no tenderness present, no inflammatory lesions present, no masses present, no atrophy present  Vagina: normal vaginal vault without central or paravaginal defects, no discharge present, no inflammatory lesions present, no masses present  Bladder: non-tender to palpation  Urethra: appears normal  Cervix: normal   Uterus: normal size, shape and consistency  Adnexa: no adnexal tenderness present, no adnexal masses present  Perineum: perineum within normal limits, no evidence of trauma, no rashes or skin lesions present. Perineal laceration well healed.     Skin  General Inspection: no rash, no lesions identified    Neurologic/Psychiatric  Mental Status:  Orientation: grossly oriented to person, place and time  Mood and Affect: mood normal, affect appropriate      Assessment/Plan:  Lynn Perry IS A 26 y.o. G1P0101   There are no diagnoses linked to this encounter.     Doing well postpartum.  No sign of PP depression or anxiety.   Breastfeeding.  Contraception -   Okay to return to normal activities (exercise, bath, intercourse).       Patient will return to office   No follow-ups on file.          Please note that portions of this note was potentially completed with Dragon dictation, the computer voice recognition software.  Quite often unanticipated grammatical, syntax, homophones,  and other interpretive errors are inadvertently transcribed by the computer software.  Please disregard these errors.  Please excuse any errors that have escaped final proofreading.  Thank you.     Stacey L. Hervey Ard, MD

## 2022-03-27 ENCOUNTER — Ambulatory Visit: Admit: 2022-03-27 | Payer: MEDICAID | Attending: Obstetrics & Gynecology

## 2022-03-27 DIAGNOSIS — Z79899 Other long term (current) drug therapy: Secondary | ICD-10-CM

## 2022-03-27 MED ORDER — BUPROPION HCL ER (XL) 300 MG PO TB24
300 MG | ORAL_TABLET | Freq: Every morning | ORAL | 3 refills | Status: AC
Start: 2022-03-27 — End: ?

## 2022-03-27 NOTE — Progress Notes (Signed)
Problem Visit    Lynn Perry is a 27 y.o. presenting for problem visit. Her main concern today is medication follow-up for postpartum depression and feelings of rage. She was started on BuSpar in combination with her Wellbutrin. She would also like to discuss heavy painful menstrual bleeding.  Patient's mother had endometriosis and had a hysterectomy in her late 44s after bearing 3 children.      Ob/Gyn Hx:  G1P0101   LMP- postpartum  Menses-heavy and painful  Contraception- none  SA- yes, female partner        Past Medical History:   Diagnosis Date    Hypothyroidism     Major depressive disorder     Preeclampsia, third trimester 11/27/2021    Psychogenic nonepileptic seizure     Rubella non-immune status, antepartum     Trauma        Past Surgical History:   Procedure Laterality Date    CESAREAN SECTION N/A 11/27/2021    CESAREAN SECTION performed by Lilian Kapur, MD at [facility] L&D OR    LYMPH NODE DISSECTION      TONSILLECTOMY Bilateral 2006       Family History   Problem Relation Age of Onset    Lupus Mother         +Diverticulitis    Hypertension Father        Social History     Socioeconomic History    Marital status: Single     Spouse name: Not on file    Number of children: Not on file    Years of education: Not on file    Highest education level: Not on file   Occupational History    Not on file   Tobacco Use    Smoking status: Never    Smokeless tobacco: Never   Vaping Use    Vaping Use: Never used   Substance and Sexual Activity    Alcohol use: Not Currently    Drug use: Not on file    Sexual activity: Yes     Partners: Male     Birth control/protection: None, Condom Female   Other Topics Concern    Not on file   Social History Narrative    Not on file     Social Determinants of Health     Financial Resource Strain: Not on file   Food Insecurity: Not on file   Transportation Needs: Not on file   Physical Activity: Not on file   Stress: Not on file   Social Connections: Not on file   Intimate Partner Violence: Not  on file   Housing Stability: Not on file       Current Outpatient Medications   Medication Sig Dispense Refill    buPROPion (WELLBUTRIN XL) 150 MG extended release tablet Take 1 tablet by mouth every morning 30 tablet 3    busPIRone (BUSPAR) 5 MG tablet Take 1 tablet by mouth 3 times daily 90 tablet 0    NIFEdipine (PROCARDIA XL) 30 MG extended release tablet Take 1 tablet by mouth daily 30 tablet 3    levothyroxine (SYNTHROID) 75 MCG tablet Take 1 tablet by mouth daily 90 tablet 1    Prenatal Vit-Fe Fumarate-FA (PRENATAL VITAMIN) 27-1 MG TABS tablet Take 1 tablet by mouth daily (Patient not taking: Reported on 01/09/2022)       No current facility-administered medications for this visit.       Allergies   Allergen Reactions    Contrast [Iodides]  Review of Systems - History obtained from the patient, Review of System is negative except as otherwise noted in the HPI      Physical Exam    BP 104/62 (Site: Right Upper Arm, Position: Sitting)   Wt 69.9 kg (154 lb)   BMI 26.43 kg/m     OBGyn Exam    Constitutional  Appearance: well-nourished, well developed, alert, in no acute distress    HENT  Head and Face: appears normal    Chest  Respiratory Effort: non-labored breathing    Cardiovascular  Heart:  Extremities: no peripheral edema    Neurologic/Psychiatric  Mental Status:  Orientation: grossly oriented to person, place and time  Mood and Affect: mood normal, affect appropriate      Assessment/Plan:  Lynn Perry IS A 27 y.o. G1P0101   1. Medication management  -Lynn Perry does not believe she is improving on Wellbutrin 150 mg daily with BuSpar 3 times a day daily.  She has been on multiple antidepressants and antianxiety medicines in the past.  Discussed increasing Wellbutrin to 300 mg daily and seeing how she feels.  Patient is amenable.  She denies homicidal or suicidal ideations.  I again recommended that she get a psychiatrist as well as a counselor or therapist.  Patient has had a bad experience with  counseling and therapy in the past    2. Menorrhagia with regular cycle  -Lynn Perry has a history of heavy painful menstrual cycles before she got pregnant.  Patient states since delivery her.'s have been much more painful.  She reports having emesis due to the pain.  She takes Midol and other over-the-counter pain medication but she does not feel as though this helps with the discomfort she has with her menstrual cycle.  Her mother has a history of endometriosis requiring hysterectomy in her late 97s.  -We discussed the differential to painful heavy menses in detail.  Will obtain labs.  Will obtain an ultrasound.  - T4  - TSH  - CBC       Patient will return to office   Return [place] for HMB/Dysmenorrhea.     30+ minutes spent with the patient including face to face interaction and chart review.        Please note that portions of this note was potentially completed with Dragon dictation, the computer voice recognition software.  Quite often unanticipated grammatical, syntax, homophones, and other interpretive errors are inadvertently transcribed by the computer software.  Please disregard these errors.  Please excuse any errors that have escaped final proofreading.  Thank you.        Lynn Perry L. Lynn Ard, MD

## 2022-03-28 LAB — T4: T4, Total: 7.5 ug/dL (ref 4.5–12.0)

## 2022-03-28 LAB — CBC
Hematocrit: 38.6 % (ref 34.0–46.6)
Hemoglobin: 12.8 g/dL (ref 11.1–15.9)
MCH: 29 pg (ref 26.6–33.0)
MCHC: 33.2 g/dL (ref 31.5–35.7)
MCV: 88 fL (ref 79–97)
Platelets: 244 10*3/uL (ref 150–450)
RBC: 4.41 x10E6/uL (ref 3.77–5.28)
RDW: 12.4 % (ref 11.7–15.4)
WBC: 7.6 10*3/uL (ref 3.4–10.8)

## 2022-03-28 LAB — TSH: TSH: 1.39 u[IU]/mL (ref 0.450–4.500)

## 2022-04-10 ENCOUNTER — Ambulatory Visit: Admit: 2022-04-10 | Payer: MEDICAID

## 2022-04-10 ENCOUNTER — Encounter

## 2022-04-10 ENCOUNTER — Ambulatory Visit: Admit: 2022-04-10 | Payer: MEDICAID | Attending: Obstetrics & Gynecology

## 2022-04-10 DIAGNOSIS — N946 Dysmenorrhea, unspecified: Secondary | ICD-10-CM

## 2022-04-10 DIAGNOSIS — N939 Abnormal uterine and vaginal bleeding, unspecified: Secondary | ICD-10-CM

## 2022-04-10 MED ORDER — IBUPROFEN 800 MG PO TABS
800 MG | ORAL_TABLET | Freq: Three times a day (TID) | ORAL | 1 refills | Status: AC | PRN
Start: 2022-04-10 — End: ?

## 2022-04-10 MED ORDER — ACETAMINOPHEN 500 MG PO TABS
500 MG | ORAL_TABLET | Freq: Three times a day (TID) | ORAL | 1 refills | Status: AC | PRN
Start: 2022-04-10 — End: ?

## 2022-04-10 NOTE — Progress Notes (Unsigned)
Problem Visit    Lynn Perry is a 27 y.o. presenting for problem visit. Her main concern today is follow-up from [place] to discuss findings and plan of care. TVUS ordered for heavy painful menstrual bleeding in the postpartum period. Patient's mother had endometriosis and had a hysterectomy in her late 58s after bearing 3 children.     [place] today demonstrates the following -       Ob/Gyn Hx:  G1P0101   LMP- postpartum  Menses-heavy and painful  Contraception- none  SA- yes, female partner        Past Medical History:   Diagnosis Date    Hypothyroidism     Major depressive disorder     Preeclampsia, third trimester 11/27/2021    Psychogenic nonepileptic seizure     Rubella non-immune status, antepartum     Trauma        Past Surgical History:   Procedure Laterality Date    CESAREAN SECTION N/A 11/27/2021    CESAREAN SECTION performed by Rachael Darby, MD at [facility] L&D OR    LYMPH NODE DISSECTION      TONSILLECTOMY Bilateral 2006       Family History   Problem Relation Age of Onset    Lupus Mother         +Diverticulitis    Hypertension Father        Social History     Socioeconomic History    Marital status: Single     Spouse name: Not on file    Number of children: Not on file    Years of education: Not on file    Highest education level: Not on file   Occupational History    Not on file   Tobacco Use    Smoking status: Never    Smokeless tobacco: Never   Vaping Use    Vaping Use: Never used   Substance and Sexual Activity    Alcohol use: Not Currently    Drug use: Not on file    Sexual activity: Yes     Partners: Male     Birth control/protection: None, Condom Female   Other Topics Concern    Not on file   Social History Narrative    Not on file     Social Determinants of Health     Financial Resource Strain: Not on file   Food Insecurity: Not on file   Transportation Needs: Not on file   Physical Activity: Not on file   Stress: Not on file   Social Connections: Not on file   Intimate Partner Violence: Not on file   Housing  Stability: Not on file       Current Outpatient Medications   Medication Sig Dispense Refill    buPROPion (WELLBUTRIN XL) 300 MG extended release tablet Take 1 tablet by mouth every morning 30 tablet 3    NIFEdipine (PROCARDIA XL) 30 MG extended release tablet Take 1 tablet by mouth daily 30 tablet 3    levothyroxine (SYNTHROID) 75 MCG tablet Take 1 tablet by mouth daily 90 tablet 1     No current facility-administered medications for this visit.       Allergies   Allergen Reactions    Contrast [Iodides]        Review of Systems - History obtained from the patient, Review of System is negative except as otherwise noted in the HPI      Physical Exam    There were no vitals taken for  this visit.    OBGyn Exam    Constitutional  Appearance: well-nourished, well developed, alert, in no acute distress    HENT  Head and Face: appears normal    Neck  Inspection/Palpation: normal appearance, no masses or tenderness  Thyroid: gland size normal, nontender    Chest  Respiratory Effort: non-labored breathing    Cardiovascular  Heart:  Extremities: no peripheral edema    Gastrointestinal  Abdominal Examination: abdomen non-tender to palpation, non-distended    Skin  General Inspection: no rash, no lesions identified    Neurologic/Psychiatric  Mental Status:  Orientation: grossly oriented to person, place and time  Mood and Affect: mood normal, affect appropriate      Assessment/Plan:  Lynn Perry IS A 26 y.o. G1P0101   1. Abnormal uterine bleeding (AUB)  ***       Patient will return to office   No follow-ups on file.          Please note that portions of this note was potentially completed with Dragon dictation, the computer voice recognition software.  Quite often unanticipated grammatical, syntax, homophones, and other interpretive errors are inadvertently transcribed by the computer software.  Please disregard these errors.  Please excuse any errors that have escaped final proofreading.  Thank you.        Stacey L. Lambert Mody,  MD

## 2022-09-24 IMAGING — US US OB < 14 WEEKS - US OB TV
1 series · 15 of 19 positions shown · non-contrast
Comparison: None.

CLINICAL DATA: Vaginal bleeding.  Positive beta HCG

EXAM:
OBSTETRIC <14 WK US AND TRANSVAGINAL OB US
TECHNIQUE: Both transabdominal and transvaginal ultrasound examinations were
performed for complete evaluation of the gestation as well as the
maternal uterus, adnexal regions, and pelvic cul-de-sac.
Transvaginal technique was performed to assess early pregnancy.

[Series 1: us ob < 14 weeks - us ob tv · 19 acquisitions, 15 frames shown]
[im 1/19]
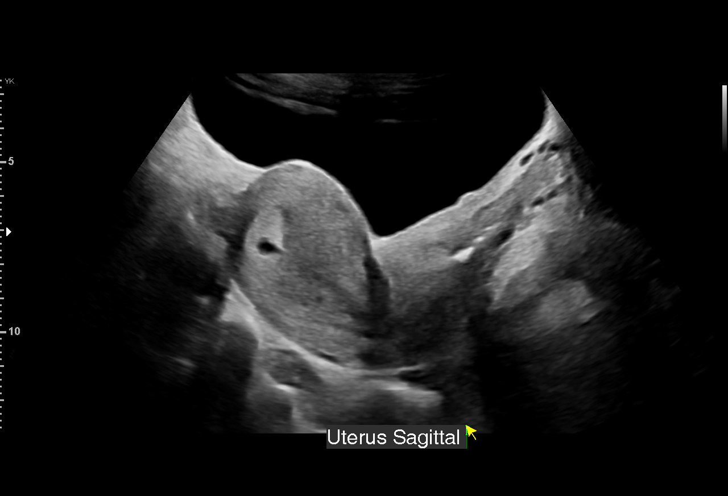
[im 2/19]
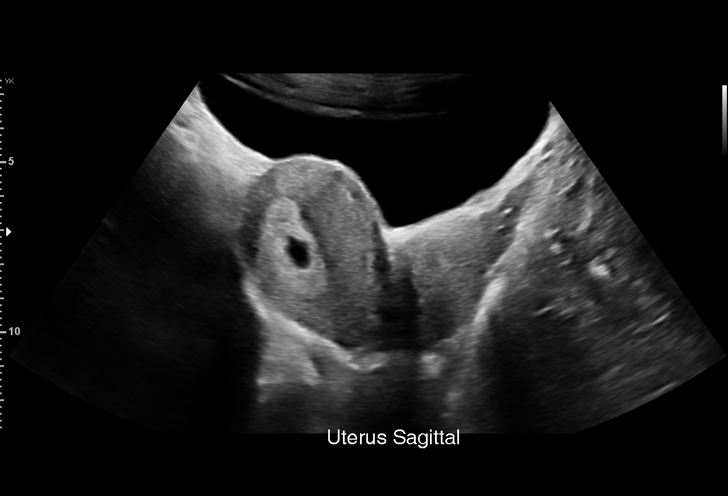
[im 4/19]
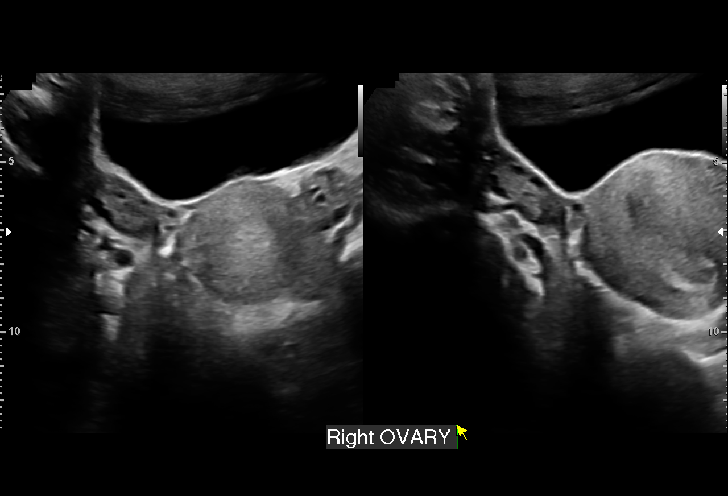
[im 5/19]
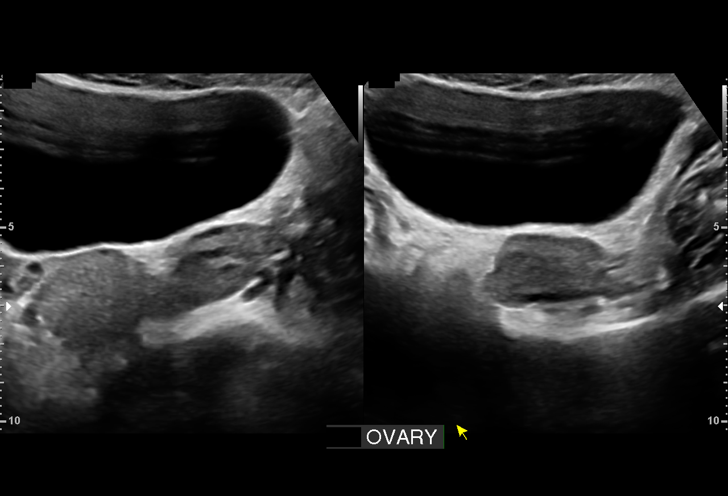
[im 6/19]
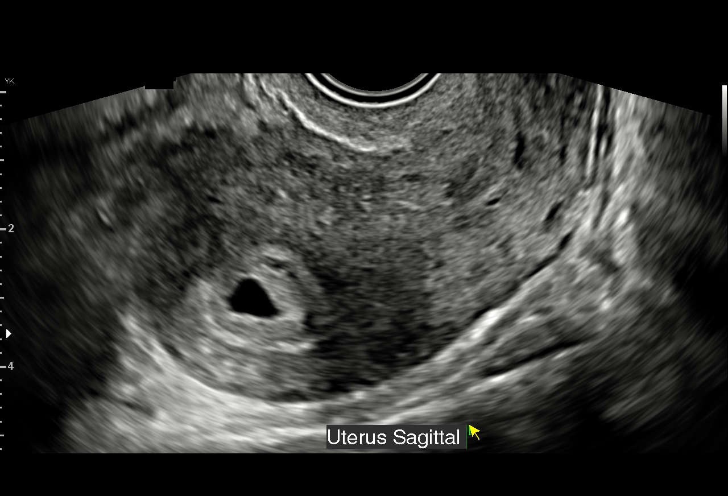
[im 7/19]
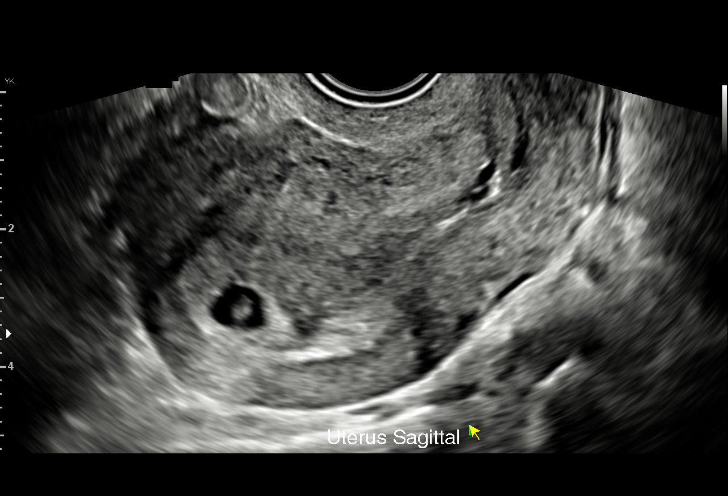
[im 9/19]
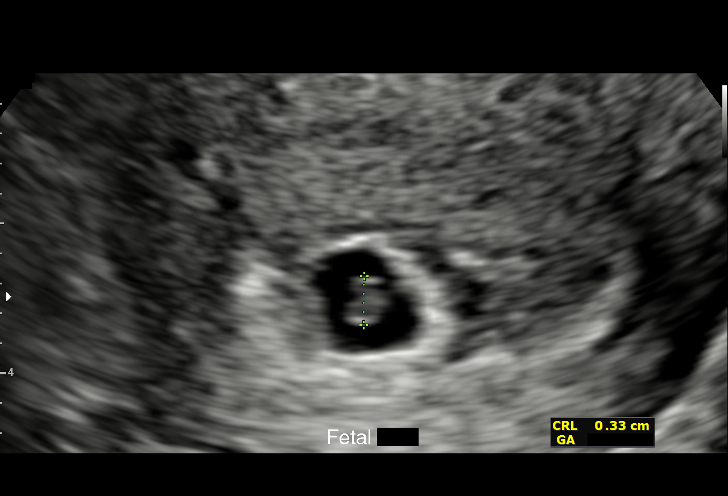
[im 10/19]
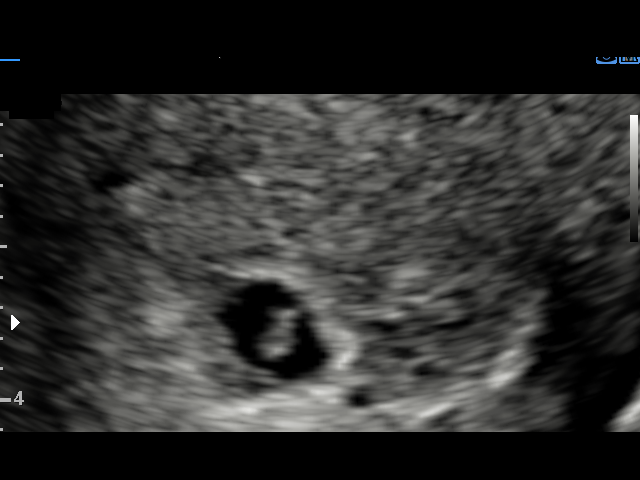
[im 11/19]
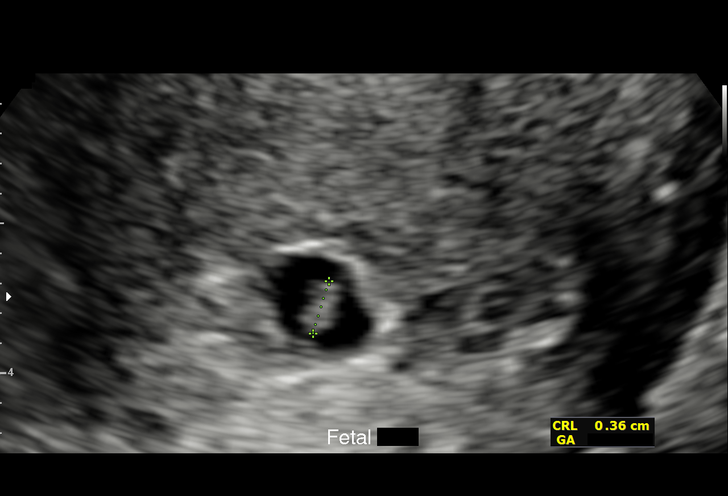
[im 13/19]
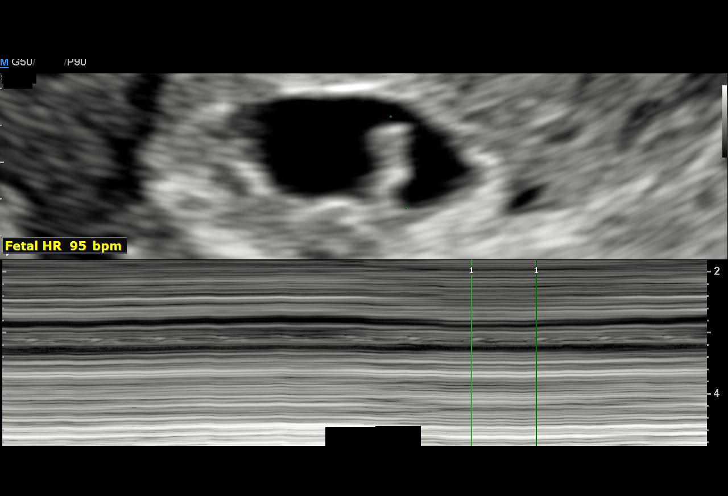
[im 14/19]
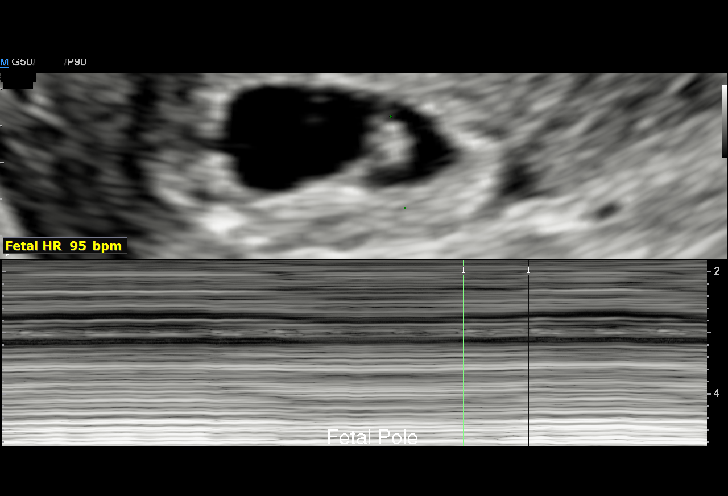
[im 15/19]
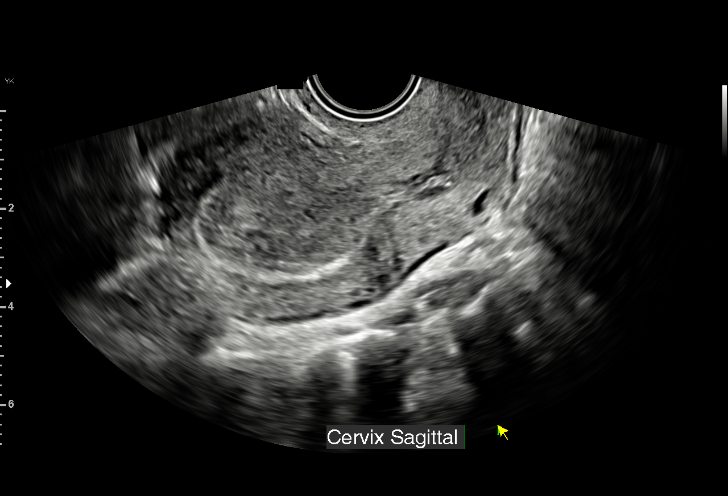
[im 16/19]
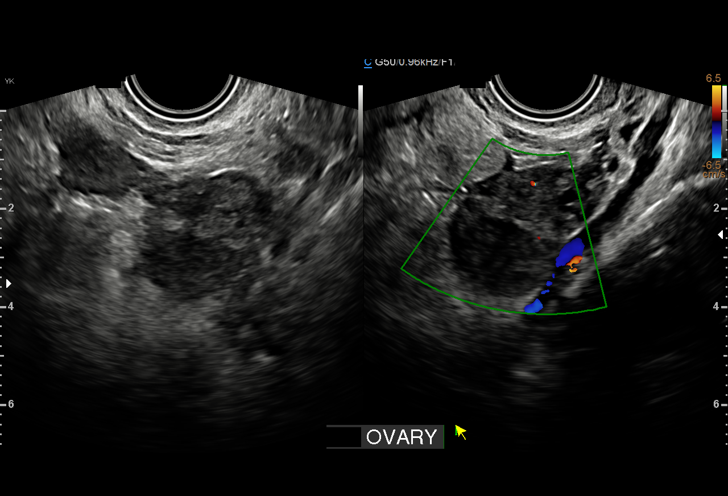
[im 18/19]
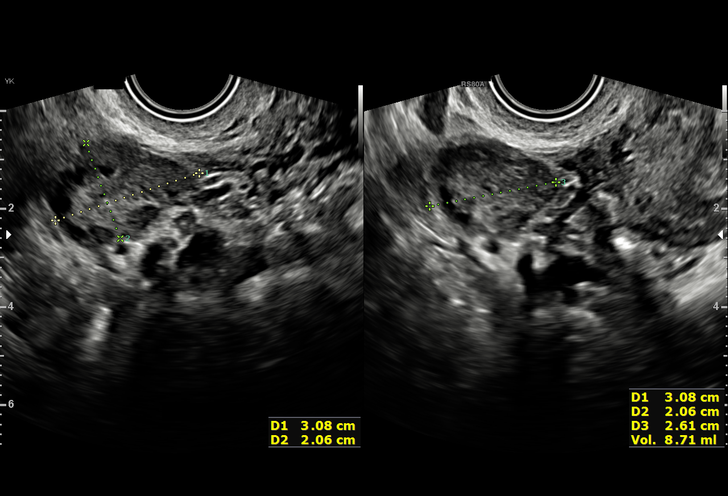
[im 19/19]
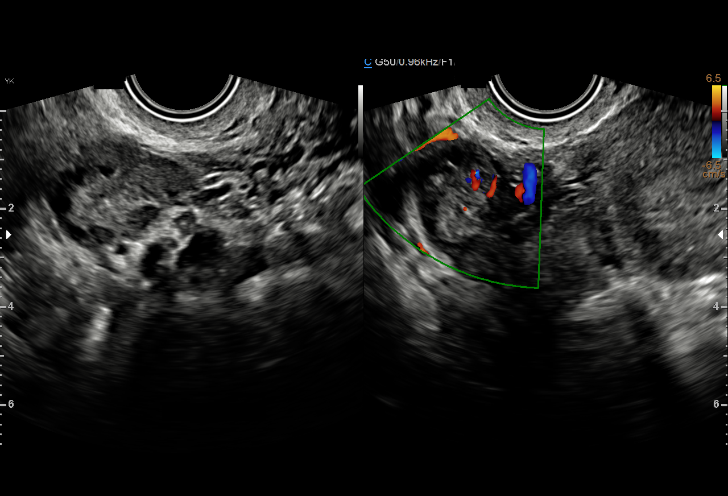

[15 of 19 positions shown; findings below may reference images not displayed]

FINDINGS: Intrauterine gestational sac: Single

Yolk sac:  Not Visualized.

Embryo:  Visualized.

Cardiac Activity: Visualized.

Heart Rate: 97 bpm

CRL:  3.4 mm   5 w   6 d                  US EDC: 01/08/2022

Subchorionic hemorrhage:  None visualized.

Maternal uterus/adnexae: Normal appearance of the ovaries and
adnexal regions. No free fluid within the pelvis.
IMPRESSION: 1. Single live intrauterine gestation measuring 5 weeks 6 days by
crown-rump length.
2. Fetal bradycardia with heart rate of 97 BPM. Recommend close
clinical follow-up and short interval repeat ultrasound to assess
viability.

## 2022-10-12 IMAGING — US US OB COMP LESS 14 WK
1 series · 15 of 21 positions shown · non-contrast
Comparison: 05/14/2021

CLINICAL DATA: Follow-up viability, bradycardia

EXAM:
OBSTETRIC <14 WK ULTRASOUND
TECHNIQUE: Transabdominal ultrasound was performed for evaluation of the
gestation as well as the maternal uterus and adnexal regions.

[Series 1: us ob comp less 14 wk · 15 of 21 slices shown]
[im 1/21]
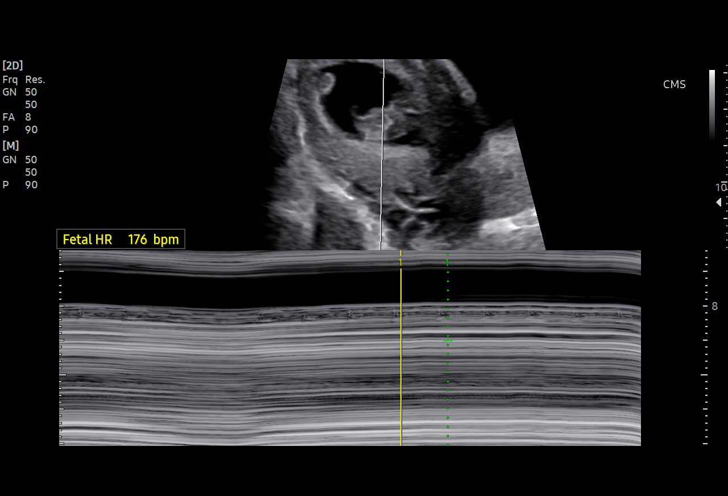
[im 3/21]
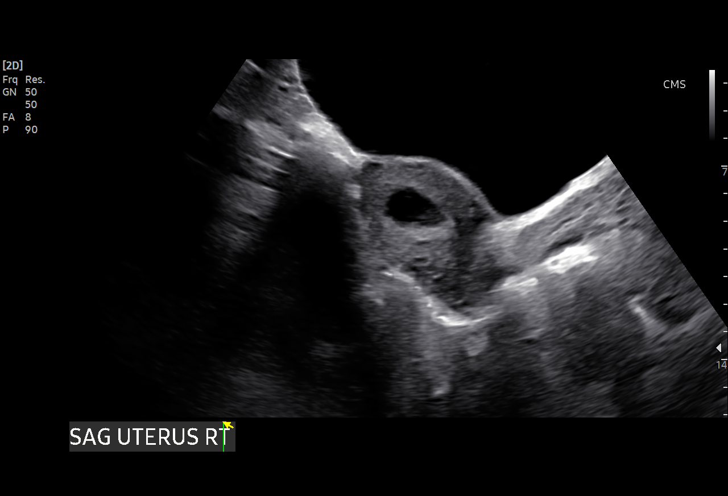
[im 4/21]
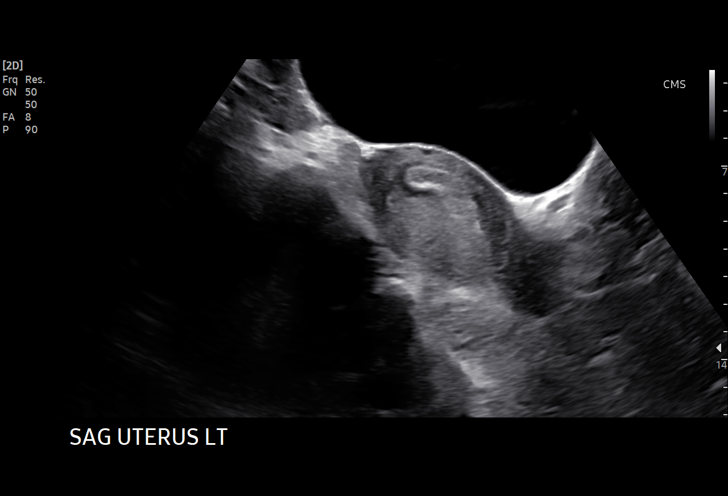
[im 5/21]
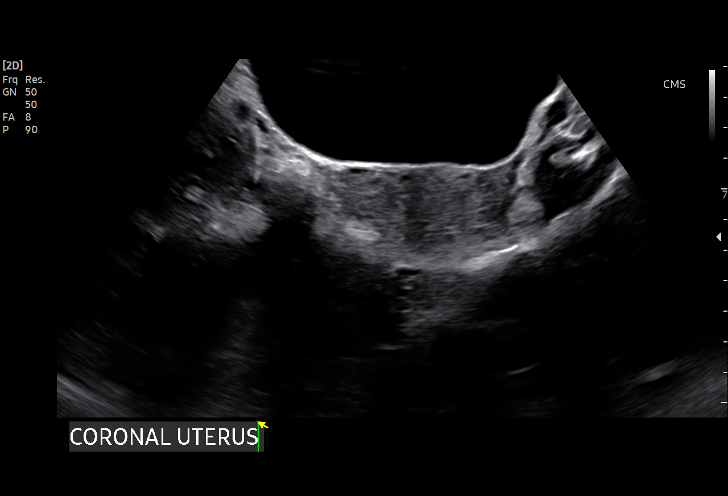
[im 7/21]
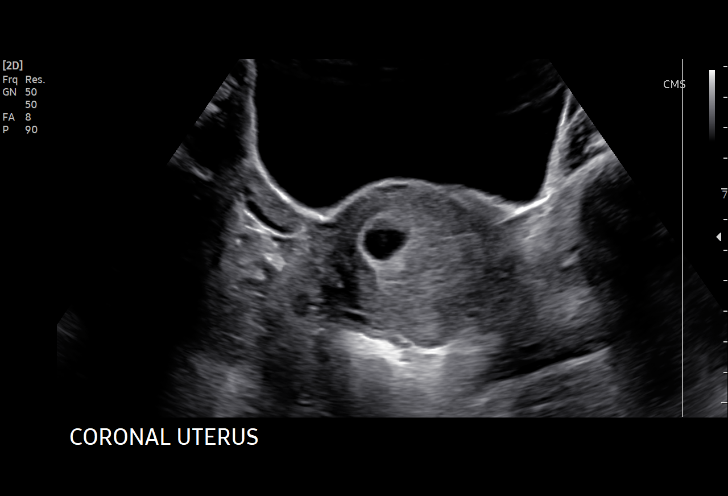
[im 8/21]
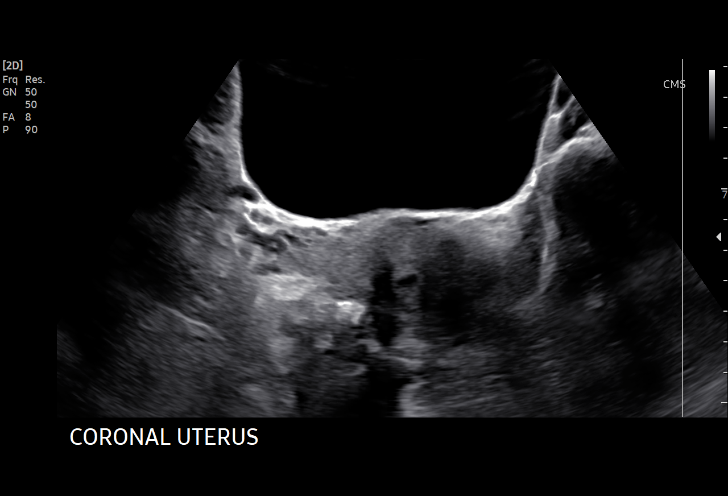
[im 10/21]
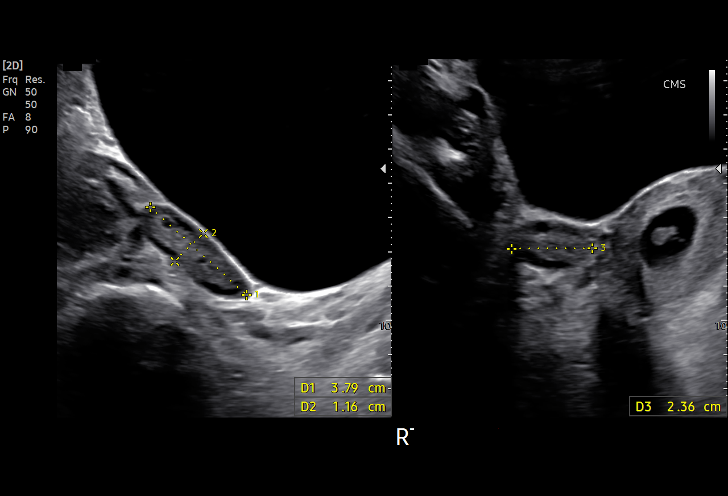
[im 11/21]
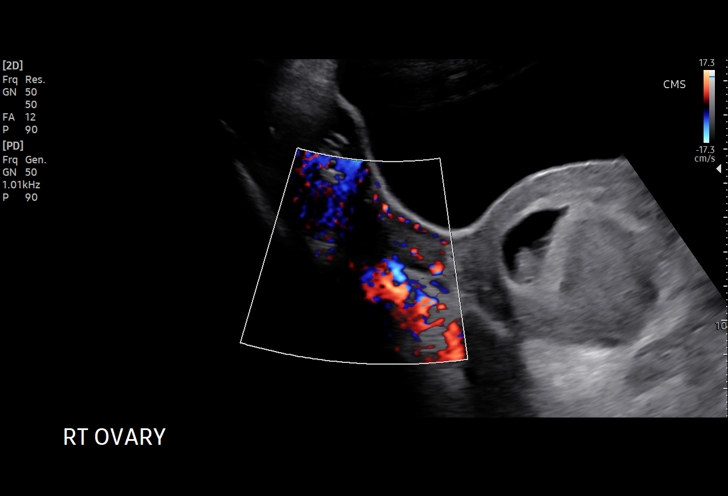
[im 12/21]
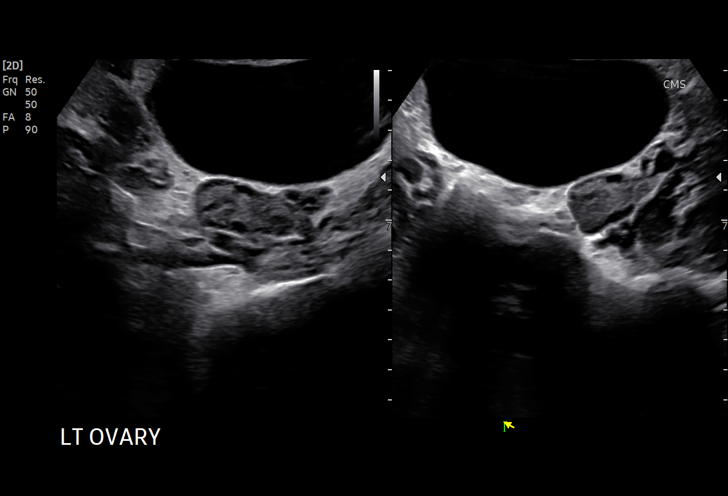
[im 14/21]
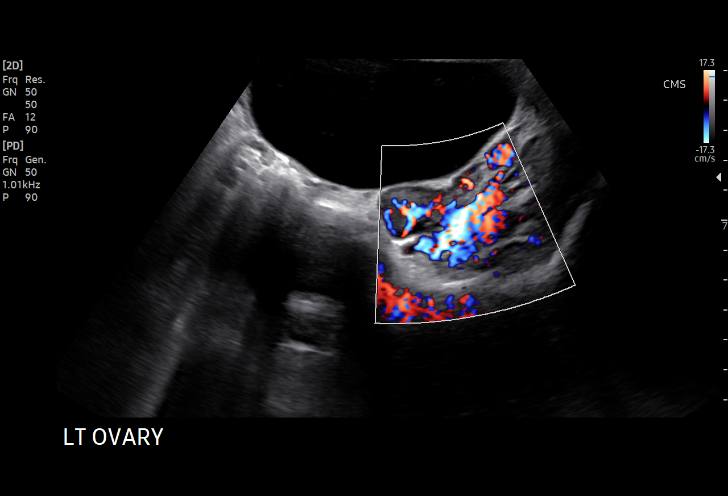
[im 15/21]
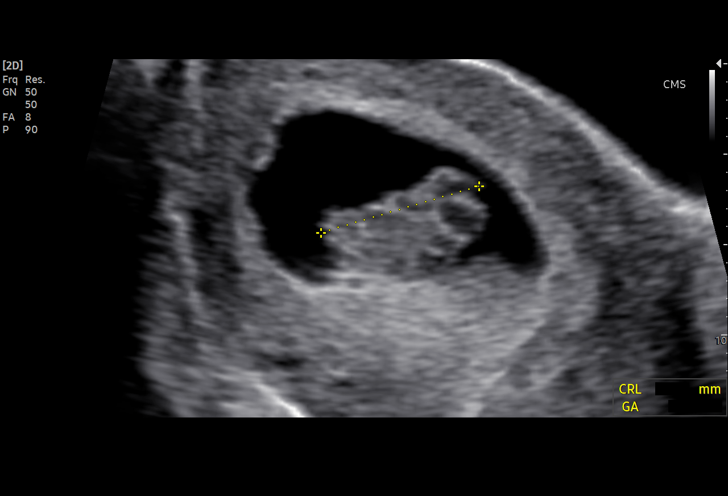
[im 17/21]
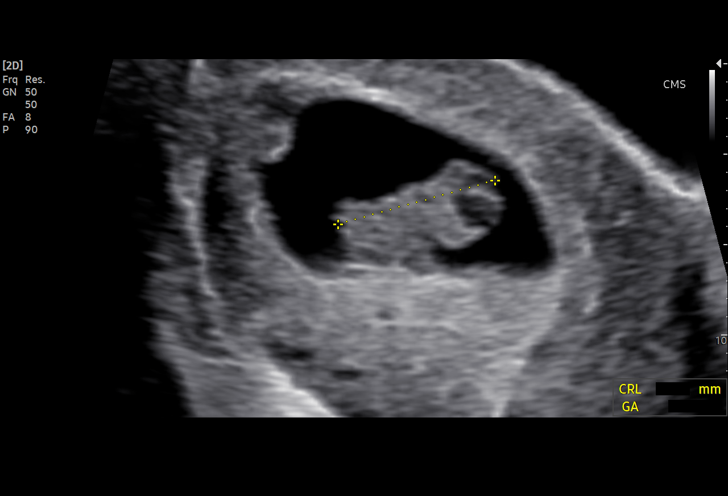
[im 18/21]
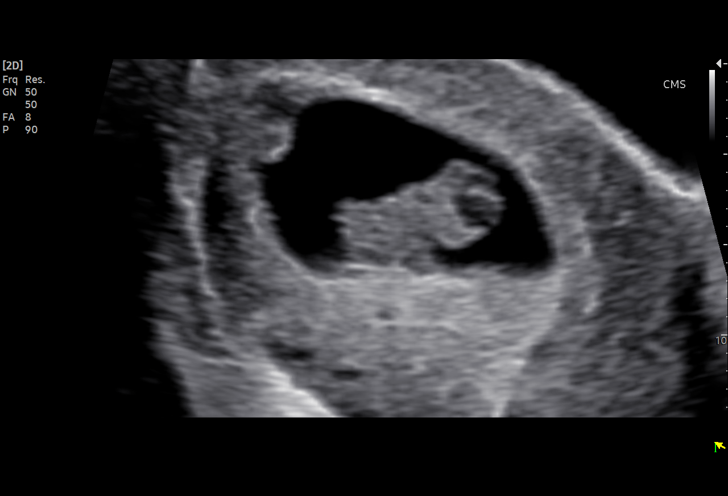
[im 19/21]
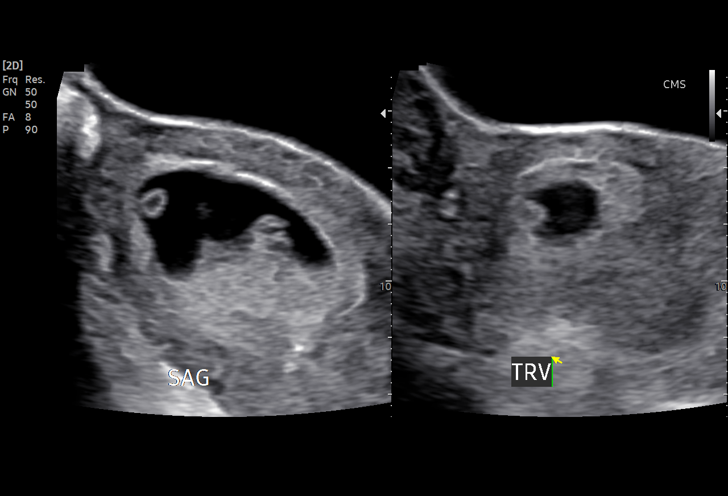
[im 21/21]
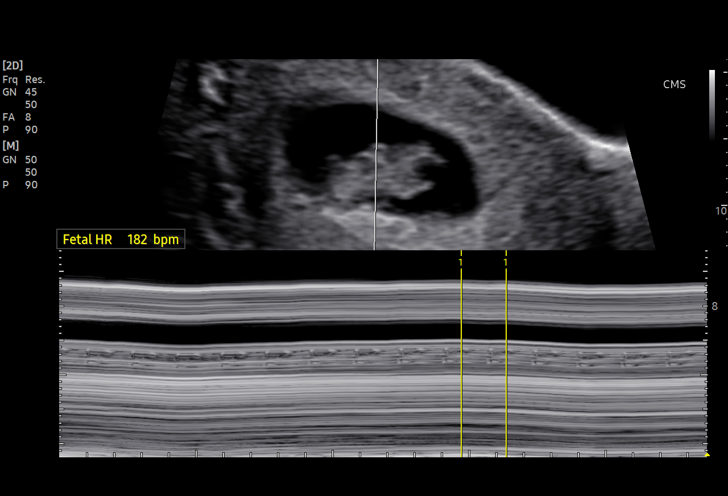

[15 of 21 positions shown; findings below may reference images not displayed]

FINDINGS: Intrauterine gestational sac: Single

Yolk sac:  Visualized.

Embryo:  Visualized.

Cardiac Activity: Visualized.

Heart Rate: 176 bpm

CRL:   18.0 mm   8 w 2 d                  US EDC: 01/09/2022

Subchorionic hemorrhage:  None visualized.

Maternal uterus/adnexae: Unremarkable.
IMPRESSION: Single intrauterine gestation at sonographic gestational age of 8
weeks, 2 days. EDD 01/09/2022. Fetal heart rate 176 beats per
minute.

## 2023-09-13 ENCOUNTER — Ambulatory Visit
Admit: 2023-09-13 | Discharge: 2023-09-13 | Payer: Medicaid (Managed Care) | Attending: Internal Medicine | Primary: Internal Medicine

## 2023-09-13 VITALS — BP 112/70 | HR 77 | Temp 97.90000°F | Resp 18 | Ht 64.0 in | Wt 139.1 lb

## 2023-09-13 DIAGNOSIS — G43009 Migraine without aura, not intractable, without status migrainosus: Principal | ICD-10-CM

## 2023-09-13 MED ORDER — NURTEC 75 MG PO TBDP
75 | ORAL_TABLET | ORAL | 5 refills | 30.00000 days | Status: AC
Start: 2023-09-13 — End: 2024-09-12

## 2023-09-13 NOTE — Progress Notes (Signed)
 HISTORY OF PRESENT ILLNESS   Lynn Perry   is a 28 y.o.  female.  Hx Migraines, hypothyroid -normal TSH since had last child 2023, hx postpartum depression. Hx PTSD and depression ,Hx preeclampsia  Psx-tonsillectomy. Hx cervical LN removal-benign    Here for establish and CPE    Gyn Dr Fredericka Coombe Neurologist in [place] 2-3 years agop    Moved to [place] area 2 years ago from [place] in 2023  Was student at [employer] now working and lives with boyfriend    No PCP locally    Has migraines at least 1 d per week but usually 1-2 times per week  On elavil for prophylaxis but in groggy in mornings from the medication  Takes imitrex but has to leave work and go to sleep migraines occure. Sometimes n/v when headaches are severe. Sometimes has visual aura   Migraines `1-2 per week  Needs FMLA for work absences    Works at [employer]  Single 1 young child . Lives with boyfriend  Patient Active Problem List    Diagnosis Date Noted    [redacted] weeks gestation of pregnancy 10/06/2021    Preeclampsia, third trimester 11/27/2021    Non-reassuring electronic fetal monitoring tracing 11/25/2021     Current Outpatient Medications   Medication Sig Dispense Refill    amitriptyline (ELAVIL) 75 MG tablet by NOT APPLICABLE route      SUMAtriptan (IMITREX) 100 MG tablet Take 1 tablet by mouth at the start of the headache. If no relief after 2 hours, take another 1 tablet by mouth. Max 2 tablets in 24 hours.      ibuprofen  (ADVIL ;MOTRIN ) 800 MG tablet Take 1 tablet by mouth every 8 hours as needed for Pain (Patient not taking: Reported on 09/13/2023) 60 tablet 1    acetaminophen  (TYLENOL ) 500 MG tablet Take 2 tablets by mouth every 8 hours as needed for Pain (Patient not taking: Reported on 09/13/2023) 60 tablet 1    buPROPion  (WELLBUTRIN  XL) 300 MG extended release tablet Take 1 tablet by mouth every morning (Patient not taking: Reported on 09/13/2023) 30 tablet 3    NIFEdipine  (PROCARDIA  XL) 30 MG extended release tablet Take 1 tablet by  mouth daily (Patient not taking: Reported on 09/13/2023) 30 tablet 3    levothyroxine  (SYNTHROID ) 75 MCG tablet Take 1 tablet by mouth daily (Patient not taking: Reported on 09/13/2023) 90 tablet 1     No current facility-administered medications for this visit.     Allergies   Allergen Reactions    Contrast [Iodides]      Past Medical History:   Diagnosis Date    Hypothyroidism     Major depressive disorder     Preeclampsia, third trimester 11/27/2021    Psychogenic nonepileptic seizure     Rubella non-immune status, antepartum     Trauma      Past Surgical History:   Procedure Laterality Date    CESAREAN SECTION N/A 11/27/2021    CESAREAN SECTION performed by Harlene LITTIE Fredericka, MD at [facility]    LYMPH NODE DISSECTION      TONSILLECTOMY Bilateral 2006     Family History   Problem Relation Age of Onset    Lupus Mother         +Diverticulitis    Hypertension Father      Social History     Tobacco Use    Smoking status: Never    Smokeless tobacco: Never  Substance Use Topics    Alcohol use: Not Currently        BMP:   Lab Results   Component Value Date/Time    NA 142 12/04/2021 12:00 AM    K 4.9 12/04/2021 12:00 AM    CL 108 12/04/2021 12:00 AM    CO2 21 12/04/2021 12:00 AM    BUN 18 12/04/2021 12:00 AM    CREATININE 0.68 12/04/2021 12:00 AM    GLUCOSE 59 12/04/2021 12:00 AM    CALCIUM  9.3 12/04/2021 12:00 AM      CBC:   Lab Results   Component Value Date/Time    WBC 7.6 03/27/2022 12:00 AM    RBC 4.41 03/27/2022 12:00 AM    HGB 12.8 03/27/2022 12:00 AM    HCT 38.6 03/27/2022 12:00 AM    MCV 88 03/27/2022 12:00 AM    MCH 29.0 03/27/2022 12:00 AM    MCHC 33.2 03/27/2022 12:00 AM    RDW 12.4 03/27/2022 12:00 AM    PLT 244 03/27/2022 12:00 AM    MPV 11.4 12/01/2021 11:00 AM      Lipids No results found for: CHOL, TRIG, HDL, CHOLHDLRATIO  Hemoglobin A1C: No results found for: LABA1C, HBA1CPOC     Review of Systems     Physical Exam  Constitutional:       Appearance: Normal appearance.      Comments: Well  appearing, pleasant   HENT:      Head: Normocephalic.      Right Ear: Tympanic membrane normal.      Left Ear: Tympanic membrane normal.      Nose: Nose normal.   Cardiovascular:      Rate and Rhythm: Normal rate and regular rhythm.      Pulses: Normal pulses.   Pulmonary:      Effort: Pulmonary effort is normal.   Abdominal:      General: Abdomen is flat. Bowel sounds are normal.   Musculoskeletal:         General: Normal range of motion.      Cervical back: Normal range of motion.      Right lower leg: No edema.   Skin:     Comments: Several tattoos   Neurological:      General: No focal deficit present.      Mental Status: She is alert.   Psychiatric:         Mood and Affect: Mood normal.         Behavior: Behavior normal.         Thought Content: Thought content normal.         Judgment: Judgment normal.          ASSESSMENT and PLAN  There are no diagnoses linked to this encounter.   [name] was seen today for new patient.    Diagnoses and all orders for this visit:    Migraine without aura and without status migrainosus, not intractable  -     rimegepant sulfate  (NURTEC) 75 MG TBDP; Take 1 tablet by mouth every other day   Take med qod due to frequent migraines   Taper off elavil due to side effects   Can complete FMLA  Routine physical examination  -     TSH; Future  -     CBC; Future  -     Comprehensive Metabolic Panel; Future  -     Lipid Panel; Future   See Gyn MD   Continue healthy lifestyle  Rtc 1 year CPE  Selinda Ruth, MD
